# Patient Record
Sex: Male | Born: 1945
Health system: Southern US, Community
[De-identification: ages and names within clinical notes are randomized; demographics above are authoritative.]

## PROBLEM LIST (undated history)

## (undated) DIAGNOSIS — K5710 Diverticulosis of small intestine without perforation or abscess without bleeding: Secondary | ICD-10-CM

## (undated) DIAGNOSIS — C801 Malignant (primary) neoplasm, unspecified: Secondary | ICD-10-CM

## (undated) DIAGNOSIS — I714 Abdominal aortic aneurysm, without rupture, unspecified: Secondary | ICD-10-CM

## (undated) DIAGNOSIS — Z720 Tobacco use: Secondary | ICD-10-CM

## (undated) DIAGNOSIS — K2270 Barrett's esophagus without dysplasia: Secondary | ICD-10-CM

## (undated) DIAGNOSIS — E7800 Pure hypercholesterolemia, unspecified: Secondary | ICD-10-CM

## (undated) DIAGNOSIS — K219 Gastro-esophageal reflux disease without esophagitis: Secondary | ICD-10-CM

## (undated) DIAGNOSIS — Z860100 Personal history of colon polyps, unspecified: Secondary | ICD-10-CM

## (undated) DIAGNOSIS — K5730 Diverticulosis of large intestine without perforation or abscess without bleeding: Secondary | ICD-10-CM

## (undated) DIAGNOSIS — J449 Chronic obstructive pulmonary disease, unspecified: Secondary | ICD-10-CM

## (undated) DIAGNOSIS — I10 Essential (primary) hypertension: Secondary | ICD-10-CM

## (undated) DIAGNOSIS — I6522 Occlusion and stenosis of left carotid artery: Secondary | ICD-10-CM

## (undated) HISTORY — PX: PROSTATECTOMY: SHX69

## (undated) HISTORY — DX: Gastro-esophageal reflux disease without esophagitis: K21.9

## (undated) HISTORY — PX: CATARACT EXTRACTION: SUR2

## (undated) HISTORY — DX: Diverticulosis of large intestine without perforation or abscess without bleeding: K57.30

## (undated) HISTORY — DX: Diverticulosis of small intestine without perforation or abscess without bleeding: K57.10

## (undated) HISTORY — DX: Barrett's esophagus without dysplasia: K22.70

## (undated) HISTORY — DX: Tobacco use: Z72.0

## (undated) HISTORY — DX: Personal history of colonic polyps: Z86.010

## (undated) HISTORY — DX: Occlusion and stenosis of left carotid artery: I65.22

## (undated) HISTORY — PX: COLONOSCOPY: SHX174

## (undated) HISTORY — PX: ADENOIDECTOMY: SUR15

## (undated) HISTORY — DX: Personal history of colon polyps, unspecified: Z86.0100

## (undated) HISTORY — DX: Essential (primary) hypertension: I10

## (undated) HISTORY — PX: TONSILLECTOMY: SUR1361

## (undated) HISTORY — DX: Pure hypercholesterolemia, unspecified: E78.00

---

## 2002-11-19 ENCOUNTER — Ambulatory Visit (HOSPITAL_COMMUNITY): Admission: RE | Admit: 2002-11-19 | Discharge: 2002-11-19 | Payer: Self-pay | Admitting: *Deleted

## 2002-11-19 ENCOUNTER — Encounter (INDEPENDENT_AMBULATORY_CARE_PROVIDER_SITE_OTHER): Payer: Self-pay | Admitting: *Deleted

## 2004-07-07 ENCOUNTER — Encounter (INDEPENDENT_AMBULATORY_CARE_PROVIDER_SITE_OTHER): Payer: Self-pay | Admitting: Specialist

## 2004-07-07 ENCOUNTER — Ambulatory Visit (HOSPITAL_COMMUNITY): Admission: RE | Admit: 2004-07-07 | Discharge: 2004-07-07 | Payer: Self-pay | Admitting: *Deleted

## 2008-01-18 ENCOUNTER — Ambulatory Visit: Admission: RE | Admit: 2008-01-18 | Discharge: 2008-02-18 | Payer: Self-pay | Admitting: Radiation Oncology

## 2008-03-24 ENCOUNTER — Inpatient Hospital Stay (HOSPITAL_COMMUNITY): Admission: RE | Admit: 2008-03-24 | Discharge: 2008-03-25 | Payer: Self-pay | Admitting: Urology

## 2008-03-24 ENCOUNTER — Encounter: Payer: Self-pay | Admitting: Urology

## 2010-08-17 NOTE — Op Note (Signed)
NAME:  Randy Morton, Randy Morton NO.:  [id number]   MEDICAL RECORD NO.:  [id number]          PATIENT TYPE:  INP   LOCATION:  1410                         FACILITY:  [facility]   PHYSICIAN:  Excell Seltzer. Annabell Howells, M.D.    DATE OF BIRTH:  01/06/1946   DATE OF PROCEDURE:  03/24/2008  DATE OF DISCHARGE:                               OPERATIVE REPORT   PROCEDURE:  Robot-assisted laparoscopic radical prostatectomy with  bilateral pelvic lymphadenectomy.   PREOPERATIVE DIAGNOSIS:  T2a Gleason 7 adenocarcinoma of the prostate.   POSTOPERATIVE DIAGNOSIS:  T2a Gleason 7 adenocarcinoma of the prostate.   SURGEON:  Dr. Bjorn Pippin.   ASSISTANT:  Delia Chimes, nurse practitioner.   ANESTHESIA:  General.   BLOOD LOSS:  200 mL.   DRAINS:  20-French coude Foley catheter and Blake drain.   SPECIMEN:  Prostate and seminal vesicles with a right bladder neck  margin and left apical margin.  Bilateral pelvic lymph nodes were sent  as well.  There were no complications.   INDICATIONS:  Randy Morton is a 62-hour-old white male who was found to  have a prostate nodule and a rising PSA on biopsies.  He was found to  have 6 of 13 cores positive with a Gleason 7 adenocarcinoma.  After  reviewing the options, he had a 31 gram prostate.  After reviewing the  options, he has elected to pursue prostatectomy with pelvic lymph node  dissection.   FINDINGS OF PROCEDURE:  The patient was given IV Cipro, was taken  operating room where general anesthetic was induced.  He was placed in  the low-lithotomy position.  His abdomen was clipped.  A red rubber  rectal tube was placed.  His abdomen and genitalia were prepped with  Betadine solution.  He was draped in the usual sterile fashion.  A Foley  catheter was inserted following dilation of the urethra up to 24-French  with male sounds.  Once the Foley was in place, the video port was  marked to the left of the umbilicus 18 cm superior to the pubis.  A 2-cm  incision was made with a knife.  This was deepened with the Bovie.  A  hemostat was used to spread the subcutaneous fat to the fascia.  The  anterior rectus fascia was incised using the Bovie.  The muscle was  reflected laterally exposing the posterior sheath which was nicked with  a knife, and then a hemostat was used to perforate the posterior sheath  and peritoneum.  Finger was placed within the abdominal cavity ensuring  free movement and no adhesions.  A S retractor was then used to maintain  the tract while a 12-mm trocar was placed.  This was secured using a  figure-of-eight 0 Vicryl suture, and the abdomen was insufflated,  initially on low-flow and then on high flow once insufflation was noted.  The laparoscope was then placed, and the abdomen was inspected.  An  adhesion was noted in the left lower quadrant involving the sigmoid and  anterior abdominal wall.  Once this was noted, the remaining port sites  were  infiltrated with local anesthetic, and the right side ports were  placed including the 5-mm assistance port, the 8-mm robot port, and the  12-mm assistant port in the standard configuration.  Once these ports  were in place, laparoscopic scissors were used through the 5-mm port to  take down the adhesions in the left lower quadrant which were felt to  potentially impede placement of the left-sided robot ports.  Once these  adhesions had been lysed, the 2 left-side robot ports were placed in the  standard configuration.  Once all of the trocars were in place, the  robot was prepared and brought to the field and docked.  Once the robot  was docked, endoscopic shears were placed in the right robot arm.  PK  dissector and ProGrasp were placed in the second and third working robot  arm ports.   At this point, I moved to the console and began the dissection.  The  obliterated umbilical arteries were divided and the bladder which had  been filled with sterile fluid was dissected  off the anterior abdominal  wall.  Once the pubis had been identified, the bladder was drained, and  the dissection was carried down onto the anterior prostate.  The lateral  pelvic fascia was incised bilaterally, and the lateral plane was  developed between the sidewall and the prostate.  The puboprostatic  ligaments were taken down to aid exposure.  Once the dorsal vein had  been identified and sufficiently dissected free of surrounding  attachments, an Endo-GIA was used to divide the dorsal vein complex.  The prostate had been defatted anteriorly prior to this maneuver.  Once  the dorsal vein had been divided, we turned our attention to the bladder  neck.  The bladder neck was incised transversely using the Foley balloon  as a guide to location.  Once the bladder neck had been opened and the  Foley was identified, the Foley was brought out through the cystotomy  and placed under anterior traction using the fourth arm which had  previously been used to hold cephalad traction on the bladder.  The  posterior plane was then developed.  I felt I was a little bit closer to  the prostate particularly on the right side, and some additional tissue  was excised from the bladder neck and sent for a permanent margin.  The  posterior section was carried down until the structures were identified.  The patient been given indigo carmine at this point.  The ampulla vas  were identified.  The left ampule was grasped and dissected out,  divided, and then used to provide cephalad traction with the fourth arm.  The right ampule was then dissected out, divided, and added to the  fourth arm traction bundle.  The right seminal vesicle was dissected out  using cautery dissection followed by the left seminal vesicle.  Once all  the structures had then developed, the posterior plane was developed  between the prostate and Denonvilliers.  This was carried out apically  and laterally as far as possible using the  ProGrasp.  Once the posterior  plane had been developed, the anterior traction was released, and the  nerve spare was performed.  Initially, the right prostatic fascia was  incised, and the plane was developed between the prostate and the  neurovascular bundle from the base out to the apex.  This was then  repeated on the left side with a successful nerve spare.  Once the nerve  spare  had been performed, the prostatic pedicles were taken down using  clips.  Some residual apical and lateral attachments were taken down.  Then, I turned my attention to the urethra.  The residual dorsal vein  was divided using cautery dissection.  Cold scissors were used to divide  the urethra.  Once the urethra had been divided, a few residual apical  posterior attachments were taken down, and the prostate was moved out of  the field.  At the left apex, there was some tissue that was of concern.  This was dissected out and sent as a margin.  The prostate was somewhat  adherent at this point as well.  However, I felt I was able to dissect  away the abnormal tissue, and it was difficult to know if this was  prostatic extension or merely some periprostatic fat.  Once the prostate  had been removed and the residual left apical tissue dissected,  inspection revealed some bleeding approximately near the left pedicle.  This was oversewn with a 4-0 Vicryl.  Further bleeding was minimal and  appeared to be oozed from the periurethral area.  He had well-preserved  neurovascular bundles bilaterally.  At this point, the pelvic fossa was  irrigated.  Insufflation of the rectum was performed.  No evidence of  rectal injury was noted.  The irrigant was aspirated, and a sheet of  Surgicel was placed in the pelvic fossa to aid hemostasis during the  lymph node dissection.   At this point, we proceeded with the lymph node dissection.  The bladder  was dropped more posteriorly from the right pelvic sidewall.  The iliac  vein  was identified, and the plane was developed between the vein and  the lymph node package.  The obturator nerve was identified, and the  proximal portion of the lymph node packet was divided between clips.  The packet was then dissected down to the bifurcation of the iliacs and  clipped and then divided.  Great care was taken to avoid injury to the  iliac vein or obturator nerve.  The right packet was then removed.  The  left lymph node dissection was then performed.  The bladder was  dissected off of the pelvic sidewall.  The vein was identified.  The  packet was developed with care being taken to avoid the circumflex,  iliac and the obturator nerve.  He had scanty tissue on this side, but  the packet once again was controlled clips and removed for permanent  section.   Once the lymph node dissection had been completed and hemostasis was  assured, the Surgicel was removed, and a 2-0 Vicryl traction suture was  used to reapproximate the posterior urethra, Denonvilliers fascia, and  posterior bladder neck using a slip knot.  Once the posterior suture had  been then secured, a bicolor 4-0 Monocryl suture was passed, and a  running urethrovesical anastomosis was performed in the usual fashion.  Once the anastomosis was completed, a 20-French coude catheter was  inserted, and the balloon was filled with 15 mL of sterile fluid.  The  bladder was then irrigated without evidence of urethrovesical  anastomotic leakage.  The anastomotic suture was then tied, and the  bladder was irrigated once again, also with a good return.   At this point, a #10 round Blake drain was placed through the fourth arm  trocar which was then removed.  The drain was secured with a 3-0 nylon  suture.  The prostate specimen was  moved back into the midline, and the  robot was undocked.  An entrapment sac was then passed through the  camera port, and the camera was passed through the 12-mm assistance  port.  The prostate  was then manipulated into the entrapment sac which  was then engaged.  The video port trocar was then removed and then  replaced alongside this entrapment sac string.  The 12-mm assistant port  was then closed using 0 Vicryl and a suture passer.  Once that was  secured, the remaining ports were removed under visual inspection.  Finally, the video port was removed.  The video port suture was removed,  and the entrapment sac was brought up to the incision.  The fascial  incision was extended just sufficient to remove the specimen which was  handed off.  The anterior rectus fascia was then closed using a running  #1 Vicryl.  The periumbilical incision was then irrigated, and once  hemostasis was secured, all of the port sites with the exception of the  drain port were infiltrated with additional local anesthetic.  The port  sites were then closed with skin clips.  The drain tubing was cut to an  appropriate length and connected to bulb suction.  The Foley was  irrigated once again with return of indigo-carmine-stained urine.  The  catheter was then placed to straight drainage.  The drapes were removed.  Dressings were applied.  The patient was taken down from lithotomy  position.  His anesthetic was reversed.  He was moved to recovery room  in stable condition.  There were no complications.      Excell Seltzer. Annabell Howells, M.D.  Electronically Signed     JJW/MEDQ  D:  03/24/2008  T:  03/25/2008  Job:  [id number]

## 2010-08-20 NOTE — Op Note (Signed)
NAME:  Randy Morton, Randy Morton NO.:  [id number]   MEDICAL RECORD NO.:  [id number]          PATIENT TYPE:  AMB   LOCATION:  ENDO                         [facility]ACILITY[facility]   PHYSICIAN:  Georgiana Spinner, M.D.    DATE OF BIRTH:  23-Jun-1945   DATE OF PROCEDURE:  DATE OF DISCHARGE:                                 OPERATIVE REPORT   PROCEDURE:  Upper endoscopy.   INDICATIONS:  Gastroesophageal reflux disease with known Barrett's esophagus  for follow-up.   ANESTHESIA:  Demerol 50, Versed 5 mg.   PROCEDURE:  With the patient mildly sedated in the left lateral decubitus  position, the Olympus videoscopic endoscope was inserted in the mouth,  passed under direct vision through the esophagus which appeared normal until  we reached the distal esophagus and the gastroesophageal squamocolumnar  junction was photographed and biopsied. When entering the stomach, the  fundus, body appeared normal.  The antrum showed changes of erythema which  was photographed and biopsied.  The duodenal bulb, second portion of the  duodenum appeared normal.  From this point, the endoscope was slowly  withdrawn taking circumferential views of the duodenal mucosa until the  endoscope had been pulled back into the stomach, placed in retroflexion to  view the stomach from below.  The endoscope was then straightened and  withdrawn taking circumferential views of the remaining gastric and  esophageal mucosa. The patient's vital signs, pulse oximeter remained  stable. The patient tolerated the procedure well without apparent  complications.   FINDINGS:  Erythema of the antrum, biopsied.  Barrett's esophagus noted  previously, biopsied. Await biopsy report. The patient will call me for  results and follow-up with me as an outpatient.      GMO/MEDQ  D:  07/07/2004  T:  07/07/2004  Job:  [id number]

## 2010-08-20 NOTE — Op Note (Signed)
   NAME:  GLENNIE, RODDA NO.:  [id number]   MEDICAL RECORD NO.:  [id number]                   PATIENT TYPE:  AMB   LOCATION:  ENDO                                 FACILITY:  [facility]   PHYSICIAN:  Georgiana Spinner, M.D.                 DATE OF BIRTH:  1946-03-16   DATE OF PROCEDURE:  11/19/2002  DATE OF DISCHARGE:                                 OPERATIVE REPORT   PROCEDURE:  Upper endoscopy.   INDICATIONS:  Hemoccult positivity.   ANESTHESIA:  Demerol 60, Versed 6 mg.   DESCRIPTION OF PROCEDURE:  With the patient mildly sedated in the left  lateral decubitus position the Olympus video endoscope was inserted in the  mouth and passed under direct visualization through the esophagus which  appeared normal. We photographed the distal esophagus and took biopsies  around the perimeter of the GE junction to rule out possibility of Barrett's  esophagus.   We entered into the stomach. The fundus body, antrum, duodenal bulb and 2nd  portion of the duodenum were all viewed and appeared normal.   From this point the endoscope was slowly withdrawn, taking circumferential  views of the entire duodenal mucosa until the endoscope was then pulled back  into the stomach and placed in retroflexion to view the stomach from below.  The endoscope was then  straightened  and  withdrawn, taking circumferential  views of the remaining gastric and esophageal mucosa.   The patient's vital signs and pulse oximetry remained stable. The patient  tolerated the procedure well without apparent complications.   FINDINGS:  Unremarkable examination  with biopsies taken to rule out  possible short-segment Barrett's esophagus distally.   PLAN:  Await biopsy report. The patient will call me for results and follow  up with me as an outpatient. Proceed to colonoscopy as planned.                                               Georgiana Spinner, M.D.    GMO/MEDQ  D:  11/19/2002  T:   11/19/2002  Job:  [id number]

## 2010-08-20 NOTE — Op Note (Signed)
   NAME:  Randy Morton, Randy Morton NO.:  [id number]   MEDICAL RECORD NO.:  [id number]                   PATIENT TYPE:  AMB   LOCATION:  ENDO                                 FACILITY:  [facility]   PHYSICIAN:  Georgiana Spinner, M.D.                 DATE OF BIRTH:  03-05-1946   DATE OF PROCEDURE:  11/19/2002  DATE OF DISCHARGE:                                 OPERATIVE REPORT   PROCEDURE PERFORMED:  Colonoscopy.   ENDOSCOPIST:  Georgiana Spinner, M.D.   INDICATIONS FOR PROCEDURE:  Colon polyps.  Hemoccult positivity.   ANESTHESIA:  Additional Demerol 10 mg, Versed 2 mg.   DESCRIPTION OF PROCEDURE:  With the patient mildly sedated in the left  lateral decubitus position, the Olympus video colonoscope was inserted in  the rectum and passed under direct vision to the cecum, identified by the  ileocecal valve and appendiceal orifice, the former was photographed.  Adjacent to the appendiceal orifice was a flat polyp.  It was approximately  8 mm to 1 cm in size, apparently, and it was photographed and it was removed  using hot biopsy forceps technique setting of 20/150 blended current.  From  this point the colonoscope was slowly withdrawn taking circumferential views  of the entire colonic mucosa as we pulled all the way back to the rectum  stopping only at 60 cm from the anal verge at which point another polyp was  seen and photographed and removed using hot biopsy forceps technique, once  again with the same setting.  The endoscope was then withdrawn all the way  to the rectum which appeared normal on direct and showed hemorrhoids on  retroflex view.  The endoscope was straightened and withdrawn.  The  patient's vital signs and pulse oximeter remained stable.  The patient  tolerated the procedure well without apparent complications.   FINDINGS:  Internal hemorrhoids.  Polyps as described above at 60 cm from  the anal verge and in the cecum.   PLAN:  Await biopsy report.   Patient will call me for results and follow-up  with me as an outpatient.                                                 Georgiana Spinner, M.D.    GMO/MEDQ  D:  11/19/2002  T:  11/19/2002  Job:  [id number]

## 2011-01-07 LAB — HEMOGLOBIN AND HEMATOCRIT, BLOOD
HCT: 36.6 % — ABNORMAL LOW (ref 39.0–52.0)
HCT: 39.7 % (ref 39.0–52.0)
Hemoglobin: 14.7 g/dL (ref 13.0–17.0)

## 2011-01-07 LAB — TYPE AND SCREEN
ABO/RH(D): A POS
Antibody Screen: NEGATIVE

## 2011-01-07 LAB — BASIC METABOLIC PANEL
Calcium: 9.4 mg/dL (ref 8.4–10.5)
GFR calc non Af Amer: >60 mL/min (ref >60)
Potassium: 4.1 mEq/L (ref 3.5–5.1)
Sodium: 138 mEq/L (ref 135–145)

## 2011-04-27 ENCOUNTER — Other Ambulatory Visit: Payer: Self-pay | Admitting: Dermatology

## 2011-05-17 ENCOUNTER — Other Ambulatory Visit: Payer: Self-pay | Admitting: Dermatology

## 2012-10-16 ENCOUNTER — Other Ambulatory Visit: Payer: Self-pay | Admitting: Dermatology

## 2013-04-23 ENCOUNTER — Other Ambulatory Visit: Payer: Self-pay | Admitting: Dermatology

## 2014-07-09 ENCOUNTER — Other Ambulatory Visit: Payer: Self-pay | Admitting: Dermatology

## 2015-07-08 DIAGNOSIS — Z85828 Personal history of other malignant neoplasm of skin: Secondary | ICD-10-CM | POA: Diagnosis not present

## 2015-07-08 DIAGNOSIS — D485 Neoplasm of uncertain behavior of skin: Secondary | ICD-10-CM | POA: Diagnosis not present

## 2015-07-08 DIAGNOSIS — Z85820 Personal history of malignant melanoma of skin: Secondary | ICD-10-CM | POA: Diagnosis not present

## 2015-07-08 DIAGNOSIS — D692 Other nonthrombocytopenic purpura: Secondary | ICD-10-CM | POA: Diagnosis not present

## 2015-07-08 DIAGNOSIS — D2361 Other benign neoplasm of skin of right upper limb, including shoulder: Secondary | ICD-10-CM | POA: Diagnosis not present

## 2015-07-08 DIAGNOSIS — D235 Other benign neoplasm of skin of trunk: Secondary | ICD-10-CM | POA: Diagnosis not present

## 2015-07-08 DIAGNOSIS — C44319 Basal cell carcinoma of skin of other parts of face: Secondary | ICD-10-CM | POA: Diagnosis not present

## 2015-07-08 DIAGNOSIS — L821 Other seborrheic keratosis: Secondary | ICD-10-CM | POA: Diagnosis not present

## 2015-07-08 DIAGNOSIS — L603 Nail dystrophy: Secondary | ICD-10-CM | POA: Diagnosis not present

## 2015-09-14 DIAGNOSIS — E7800 Pure hypercholesterolemia, unspecified: Secondary | ICD-10-CM | POA: Diagnosis not present

## 2015-09-14 DIAGNOSIS — Z125 Encounter for screening for malignant neoplasm of prostate: Secondary | ICD-10-CM | POA: Diagnosis not present

## 2015-09-21 DIAGNOSIS — E8809 Other disorders of plasma-protein metabolism, not elsewhere classified: Secondary | ICD-10-CM | POA: Diagnosis not present

## 2015-09-22 ENCOUNTER — Other Ambulatory Visit: Payer: Self-pay | Admitting: Internal Medicine

## 2015-09-22 DIAGNOSIS — R221 Localized swelling, mass and lump, neck: Secondary | ICD-10-CM

## 2015-10-08 ENCOUNTER — Ambulatory Visit
Admission: RE | Admit: 2015-10-08 | Discharge: 2015-10-08 | Disposition: A | Discharge status: Home / Self Care | Payer: Medicare Other | Source: Ambulatory Visit | Attending: Internal Medicine | Admitting: Internal Medicine

## 2015-10-08 DIAGNOSIS — R221 Localized swelling, mass and lump, neck: Secondary | ICD-10-CM

## 2015-10-08 DIAGNOSIS — E042 Nontoxic multinodular goiter: Secondary | ICD-10-CM | POA: Diagnosis not present

## 2015-10-14 DIAGNOSIS — E7800 Pure hypercholesterolemia, unspecified: Secondary | ICD-10-CM | POA: Diagnosis not present

## 2015-10-14 DIAGNOSIS — Z8719 Personal history of other diseases of the digestive system: Secondary | ICD-10-CM | POA: Diagnosis not present

## 2015-10-14 DIAGNOSIS — K219 Gastro-esophageal reflux disease without esophagitis: Secondary | ICD-10-CM | POA: Diagnosis not present

## 2015-10-14 DIAGNOSIS — Z0000 Encounter for general adult medical examination without abnormal findings: Secondary | ICD-10-CM | POA: Diagnosis not present

## 2015-10-20 DIAGNOSIS — D170 Benign lipomatous neoplasm of skin and subcutaneous tissue of head, face and neck: Secondary | ICD-10-CM | POA: Diagnosis not present

## 2015-11-26 ENCOUNTER — Telehealth: Payer: Self-pay | Admitting: Acute Care

## 2015-11-26 NOTE — Telephone Encounter (Signed)
Referral was canceled on 11/18/15 for the lung cancer screening program due to several unsuccessful attempts to contact pt. I sent notification to Dr. Pennie Banter office via fax on 11/18/15. Nothing further needed.

## 2016-05-26 DIAGNOSIS — H524 Presbyopia: Secondary | ICD-10-CM | POA: Diagnosis not present

## 2017-02-27 DIAGNOSIS — Z85828 Personal history of other malignant neoplasm of skin: Secondary | ICD-10-CM | POA: Diagnosis not present

## 2017-02-27 DIAGNOSIS — Z85820 Personal history of malignant melanoma of skin: Secondary | ICD-10-CM | POA: Diagnosis not present

## 2017-02-27 DIAGNOSIS — D235 Other benign neoplasm of skin of trunk: Secondary | ICD-10-CM | POA: Diagnosis not present

## 2017-02-27 DIAGNOSIS — C44319 Basal cell carcinoma of skin of other parts of face: Secondary | ICD-10-CM | POA: Diagnosis not present

## 2017-02-27 DIAGNOSIS — L821 Other seborrheic keratosis: Secondary | ICD-10-CM | POA: Diagnosis not present

## 2017-04-12 DIAGNOSIS — A09 Infectious gastroenteritis and colitis, unspecified: Secondary | ICD-10-CM | POA: Diagnosis not present

## 2017-04-12 DIAGNOSIS — I10 Essential (primary) hypertension: Secondary | ICD-10-CM | POA: Diagnosis not present

## 2017-04-26 DIAGNOSIS — Z125 Encounter for screening for malignant neoplasm of prostate: Secondary | ICD-10-CM | POA: Diagnosis not present

## 2017-04-26 DIAGNOSIS — Z7289 Other problems related to lifestyle: Secondary | ICD-10-CM | POA: Diagnosis not present

## 2017-04-26 DIAGNOSIS — E7800 Pure hypercholesterolemia, unspecified: Secondary | ICD-10-CM | POA: Diagnosis not present

## 2017-04-26 DIAGNOSIS — Z0000 Encounter for general adult medical examination without abnormal findings: Secondary | ICD-10-CM | POA: Diagnosis not present

## 2017-04-26 DIAGNOSIS — I10 Essential (primary) hypertension: Secondary | ICD-10-CM | POA: Diagnosis not present

## 2017-05-03 DIAGNOSIS — K219 Gastro-esophageal reflux disease without esophagitis: Secondary | ICD-10-CM | POA: Diagnosis not present

## 2017-05-03 DIAGNOSIS — Z0001 Encounter for general adult medical examination with abnormal findings: Secondary | ICD-10-CM | POA: Diagnosis not present

## 2017-05-03 DIAGNOSIS — N529 Male erectile dysfunction, unspecified: Secondary | ICD-10-CM | POA: Diagnosis not present

## 2017-05-03 DIAGNOSIS — I10 Essential (primary) hypertension: Secondary | ICD-10-CM | POA: Diagnosis not present

## 2017-05-09 ENCOUNTER — Other Ambulatory Visit: Payer: Self-pay | Admitting: Acute Care

## 2017-05-09 DIAGNOSIS — Z122 Encounter for screening for malignant neoplasm of respiratory organs: Secondary | ICD-10-CM

## 2017-05-09 DIAGNOSIS — F17210 Nicotine dependence, cigarettes, uncomplicated: Secondary | ICD-10-CM

## 2017-05-20 ENCOUNTER — Emergency Department (HOSPITAL_COMMUNITY)
Admission: EM | Admit: 2017-05-20 | Discharge: 2017-05-20 | Disposition: A | Discharge status: Home / Self Care | Payer: Medicare Other | Attending: Emergency Medicine | Admitting: Emergency Medicine

## 2017-05-20 ENCOUNTER — Other Ambulatory Visit: Payer: Self-pay

## 2017-05-20 ENCOUNTER — Emergency Department (HOSPITAL_COMMUNITY): Payer: Medicare Other

## 2017-05-20 ENCOUNTER — Encounter (HOSPITAL_COMMUNITY): Payer: Self-pay | Admitting: *Deleted

## 2017-05-20 DIAGNOSIS — R05 Cough: Secondary | ICD-10-CM | POA: Diagnosis not present

## 2017-05-20 DIAGNOSIS — J101 Influenza due to other identified influenza virus with other respiratory manifestations: Secondary | ICD-10-CM | POA: Diagnosis not present

## 2017-05-20 DIAGNOSIS — Z859 Personal history of malignant neoplasm, unspecified: Secondary | ICD-10-CM | POA: Insufficient documentation

## 2017-05-20 DIAGNOSIS — R6889 Other general symptoms and signs: Secondary | ICD-10-CM | POA: Diagnosis present

## 2017-05-20 DIAGNOSIS — F17200 Nicotine dependence, unspecified, uncomplicated: Secondary | ICD-10-CM | POA: Insufficient documentation

## 2017-05-20 HISTORY — DX: Malignant (primary) neoplasm, unspecified: C80.1

## 2017-05-20 LAB — CBC WITH DIFFERENTIAL/PLATELET
BASOS ABS: 0 10*3/uL (ref 0.0–0.1)
Basophils Relative: 0 %
EOS ABS: 0 10*3/uL (ref 0.0–0.7)
Eosinophils Relative: 0 %
HCT: 43.5 % (ref 39.0–52.0)
HEMOGLOBIN: 15.3 g/dL (ref 13.0–17.0)
LYMPHS PCT: 16 %
Lymphs Abs: 0.9 10*3/uL (ref 0.7–4.0)
MCH: 32.3 pg (ref 26.0–34.0)
MCHC: 35.2 g/dL (ref 30.0–36.0)
MCV: 91.8 fL (ref 78.0–100.0)
Monocytes Absolute: 0.5 10*3/uL (ref 0.1–1.0)
Monocytes Relative: 9 %
NEUTROS ABS: 4.3 10*3/uL (ref 1.7–7.7)
Neutrophils Relative %: 75 %
Platelets: 105 10*3/uL — ABNORMAL LOW (ref 150–400)
RBC: 4.74 MIL/uL (ref 4.22–5.81)
RDW: 13.3 % (ref 11.5–15.5)
WBC: 5.7 10*3/uL (ref 4.0–10.5)

## 2017-05-20 LAB — BASIC METABOLIC PANEL
Anion gap: 12 (ref 5–15)
BUN: 19 mg/dL (ref 6–20)
CALCIUM: 9 mg/dL (ref 8.9–10.3)
CO2: 23 mmol/L (ref 22–32)
CREATININE: 1.33 mg/dL — AB (ref 0.61–1.24)
Chloride: 99 mmol/L — ABNORMAL LOW (ref 101–111)
GFR calc non Af Amer: 51 mL/min — ABNORMAL LOW (ref >60)
GFR, EST AFRICAN AMERICAN: 60 mL/min — AB (ref >60)
Glucose, Bld: 92 mg/dL (ref 65–99)
Potassium: 4 mmol/L (ref 3.5–5.1)
SODIUM: 134 mmol/L — AB (ref 135–145)

## 2017-05-20 LAB — INFLUENZA PANEL BY PCR (TYPE A & B)
INFLAPCR: POSITIVE — AB
INFLBPCR: NEGATIVE

## 2017-05-20 LAB — I-STAT CG4 LACTIC ACID, ED: LACTIC ACID, VENOUS: 1.35 mmol/L (ref 0.5–1.9)

## 2017-05-20 MED ORDER — SODIUM CHLORIDE 0.9 % IV BOLUS (SEPSIS)
1000.0000 mL | Freq: Once | INTRAVENOUS | Status: AC
  Administered 2017-05-20: 1000 mL via INTRAVENOUS

## 2017-05-20 MED ORDER — ETOMIDATE 2 MG/ML IV SOLN: 0.3000 mg/kg | Freq: Once | INTRAVENOUS | Status: DC

## 2017-05-20 MED ORDER — OSELTAMIVIR PHOSPHATE 75 MG PO CAPS
75.0000 mg | ORAL_CAPSULE | Freq: Once | ORAL | Status: AC
Start: 2017-05-20 — End: 2017-05-20
  Administered 2017-05-20: 75 mg via ORAL
  Filled 2017-05-20: qty 1

## 2017-05-20 MED ORDER — ACETAMINOPHEN 325 MG PO TABS
650.0000 mg | ORAL_TABLET | Freq: Once | ORAL | Status: AC
  Administered 2017-05-20: 650 mg via ORAL
  Filled 2017-05-20: qty 2

## 2017-05-20 MED ORDER — OSELTAMIVIR PHOSPHATE 75 MG PO CAPS: 75.0000 mg | ORAL_CAPSULE | Freq: Two times a day (BID) | ORAL | 0 refills | Status: DC

## 2017-05-20 MED ORDER — BENZONATATE 100 MG PO CAPS: 100.0000 mg | ORAL_CAPSULE | Freq: Three times a day (TID) | ORAL | 0 refills | Status: DC

## 2017-05-20 MED ORDER — SUCCINYLCHOLINE CHLORIDE 20 MG/ML IJ SOLN: 100.0000 mg | Freq: Once | INTRAMUSCULAR | Status: DC

## 2017-05-20 NOTE — ED Triage Notes (Signed)
Has had a cold for about 3 weeks, yesterday developed body aches, today dizziness, feels bad all over, poor appetite.

## 2017-05-20 NOTE — ED Provider Notes (Signed)
[facility] DEPT Provider Note   CSN: [id number] Arrival date & time: 05/20/17  1039     History   Chief Complaint Chief Complaint  Patient presents with  . Influenza    HPI Randy Morton is a 72 y.o. male.  HPI Randy Morton is a 72 y.o. male presents to ED with complaint of cough, body aches, chills, subjective fever. States has had sinus congestion over the last 3 weeks. Worsening symptoms began 2-3 days ago. Denies nausea, vomiting, diarrhea. No chest pain or abdominal pain. Reports mild headache. No neck pain or stiffness.  Reports decreased appetite.  Is has been taking Tylenol with no relief of his symptoms.  States nothing is making his symptoms better or worse.  Past Medical History:  Diagnosis Date  . Cancer ([place])     There are no active problems to display for this patient.   Past Surgical History:  Procedure Laterality Date  . PROSTATECTOMY    . TONSILLECTOMY         Home Medications    Prior to Admission medications   Not on File    Family History No family history on file.  Social History Social History   Tobacco Use  . Smoking status: Current Every Day Smoker  . Smokeless tobacco: Never Used  Substance Use Topics  . Alcohol use: Yes  . Drug use: No     Allergies   Patient has no known allergies.   Review of Systems Review of Systems  Constitutional: Positive for appetite change, chills, fatigue and fever.  HENT: Positive for congestion and sore throat.   Respiratory: Positive for cough. Negative for chest tightness and shortness of breath.   Cardiovascular: Negative for chest pain, palpitations and leg swelling.  Gastrointestinal: Negative for abdominal distention, abdominal pain, diarrhea, nausea and vomiting.  Genitourinary: Negative for dysuria, frequency, hematuria and urgency.  Musculoskeletal: Positive for myalgias. Negative for neck pain and neck stiffness.  Skin: Negative for rash.    Allergic/Immunologic: Negative for immunocompromised state.  Neurological: Positive for tremors and headaches. Negative for dizziness, weakness, light-headedness and numbness.  All other systems reviewed and are negative.    Physical Exam Updated Vital Signs BP 109/72 (BP Location: Left Arm)   Pulse (!) 103   Temp 99.8 F (37.7 C) (Oral)   Resp 18   Ht 5\' 10"  (1.778 m)   Wt 95.7 kg (211 lb)   SpO2 96%   BMI 30.28 kg/m   Physical Exam  Constitutional: He appears well-developed and well-nourished.  rigorous  HENT:  Head: Normocephalic and atraumatic.  Oropharynx is erythematous, uvula midline, no exudate.  Nasal congestion  Eyes: Conjunctivae are normal.  Neck: Neck supple.  No meningismus  Cardiovascular: Regular rhythm and normal heart sounds.  Tachycardic  Pulmonary/Chest: Effort normal. No respiratory distress. He has no wheezes. He has no rales.  Abdominal: Soft. Bowel sounds are normal. He exhibits no distension. There is no tenderness. There is no rebound.  Musculoskeletal: He exhibits no edema.  Neurological: He is alert.  Skin: Skin is warm and dry.  Nursing note and vitals reviewed.    ED Treatments / Results  Labs (all labs ordered are listed, but only abnormal results are displayed) Labs Reviewed  INFLUENZA PANEL BY PCR (TYPE A & B) - Abnormal; Notable for the following components:      Result Value   Influenza A By PCR POSITIVE (*)    All other components within normal limits  CBC WITH DIFFERENTIAL/PLATELET - Abnormal; Notable for the following components:   Platelets 105 (*)    All other components within normal limits  BASIC METABOLIC PANEL - Abnormal; Notable for the following components:   Sodium 134 (*)    Chloride 99 (*)    Creatinine, Ser 1.33 (*)    GFR calc non Af Amer 51 (*)    GFR calc Af Amer 60 (*)    All other components within normal limits  URINALYSIS, ROUTINE W REFLEX MICROSCOPIC  I-STAT CG4 LACTIC ACID, ED  I-STAT CG4 LACTIC  ACID, ED    EKG  EKG Interpretation None       Radiology Dg Chest 2 View  Result Date: 05/20/2017 CLINICAL DATA:  Cough and congestion. EXAM: CHEST  2 VIEW COMPARISON:  Chest x-ray dated March 20, 2008. FINDINGS: The heart size and mediastinal contours are within normal limits. Both lungs are clear. The visualized skeletal structures are unremarkable. IMPRESSION: No active cardiopulmonary disease. Electronically Signed   By: Titus Dubin M.D.   On: 05/20/2017 16:13    Procedures Procedures (including critical care time)  Medications Ordered in ED Medications - No data to display   Initial Impression / Assessment and Plan / ED Course  I have reviewed the triage vital signs and the nursing notes.  Pertinent labs & imaging results that were available during my care of the patient were reviewed by me and considered in my medical decision making (see chart for details).     Patient with temperature 101.9, measured by myself after patient found to be having rigors on exam.  He is having upper respiratory symptoms, nasal congestion, cough, body aches, generalized weakness, decreased appetite.  Symptoms most concerning for influenza.  I will check basic labs, urinalysis, chest x-ray, flu panel.  Will give IV fluids and Tylenol.  Will reassess after  5:58 PM X-rays negative.  Labs overall unremarkable, creatinine slightly elevated at 1.3, do not know patient's baseline.  He received 1 L of IV fluids.  His lactic acid and white blood cell count are normal.  Chest x-ray is normal.  He is positive for influenza A.  He states he feels much better after Tylenol and fluids.  He would like to go home.  At time of reassessment and discharge, vital signs are normal.  Will discharge home with Tamiflu, first dose given in emergency department.  Will give Tessalon for cough, continue Tylenol or Motrin for fever, rest, drink plenty of fluids, follow-up with family doctor.  Return precautions  discussed.  Patient is comfortable going home, understands discharge instructions and return precautions  Vitals:   05/20/17 1529 05/20/17 1740  BP: 109/72 (!) 152/83  Pulse: (!) 103 89  Resp: 18 18  Temp: 99.8 F (37.7 C)   SpO2: 96% 96%     Final Clinical Impressions(s) / ED Diagnoses   Final diagnoses:  Influenza A    ED Discharge Orders        Ordered    oseltamivir (TAMIFLU) 75 MG capsule  Every 12 hours     05/20/17 1801    benzonatate (TESSALON) 100 MG capsule  Every 8 hours     05/20/17 1801       Jeannett Senior, PA-C 05/20/17 1802    Carmin Muskrat, MD 05/20/17 2333

## 2017-05-20 NOTE — ED Notes (Signed)
Bed: RA15 Expected date:  Expected time:  Means of arrival:  Comments: No bed

## 2017-05-20 NOTE — ED Notes (Signed)
Pt aware urine sample is needed, states that he cannot provide one at this time.

## 2017-05-20 NOTE — Discharge Instructions (Signed)
Rest.  Drink plenty of fluids.  Take Tylenol or Motrin for fever on a regular basis.  Take Tamiflu as prescribed to help with your symptoms.  If worsening, return to emergency department.  Otherwise follow-up with your family doctor for recheck in 3 days.

## 2017-05-23 DIAGNOSIS — E7800 Pure hypercholesterolemia, unspecified: Secondary | ICD-10-CM | POA: Diagnosis not present

## 2017-05-23 DIAGNOSIS — F17200 Nicotine dependence, unspecified, uncomplicated: Secondary | ICD-10-CM | POA: Diagnosis not present

## 2017-05-23 DIAGNOSIS — I10 Essential (primary) hypertension: Secondary | ICD-10-CM | POA: Diagnosis not present

## 2017-05-23 DIAGNOSIS — J111 Influenza due to unidentified influenza virus with other respiratory manifestations: Secondary | ICD-10-CM | POA: Diagnosis not present

## 2017-05-30 ENCOUNTER — Telehealth: Payer: Self-pay | Admitting: Acute Care

## 2017-05-31 NOTE — Telephone Encounter (Signed)
Noted. Randy Morton, just reschedule as you are able. Thanks so much!!

## 2017-05-31 NOTE — Telephone Encounter (Signed)
Spoke with pt and rescheduled [facility] 06/16/17 3:30 CT will be rescheduled Nothing further needed

## 2017-05-31 NOTE — Telephone Encounter (Signed)
Will route this to [place] for an [place] and for them to try to reschedule visit for pt once he is well.   Pt was supposed to come yesterday, 05/30/17 for the visit but it was cancelled.

## 2017-06-02 ENCOUNTER — Encounter: Payer: Medicare Other | Admitting: Acute Care

## 2017-06-02 ENCOUNTER — Inpatient Hospital Stay: Admission: RE | Admit: 2017-06-02 | Payer: Medicare Other | Source: Ambulatory Visit

## 2017-06-12 ENCOUNTER — Telehealth: Payer: Self-pay | Admitting: Acute Care

## 2017-06-16 ENCOUNTER — Encounter: Payer: Medicare Other | Admitting: Acute Care

## 2017-06-16 ENCOUNTER — Inpatient Hospital Stay: Admission: RE | Admit: 2017-06-16 | Payer: Medicare Other | Source: Ambulatory Visit

## 2017-06-16 NOTE — Telephone Encounter (Signed)
Pt's wife returning call. Cb is [phone].

## 2017-06-16 NOTE — Telephone Encounter (Signed)
LMTC x 1  

## 2017-06-16 NOTE — Telephone Encounter (Signed)
Spoke with pt's wife and rescheduled [facility] to 06/23/17 4:00 CT will be rescheduled Nothing further needed

## 2017-06-23 ENCOUNTER — Ambulatory Visit (INDEPENDENT_AMBULATORY_CARE_PROVIDER_SITE_OTHER)
Admission: RE | Admit: 2017-06-23 | Discharge: 2017-06-23 | Disposition: A | Discharge status: Home / Self Care | Payer: Medicare Other | Source: Ambulatory Visit | Attending: Acute Care | Admitting: Acute Care

## 2017-06-23 ENCOUNTER — Ambulatory Visit (INDEPENDENT_AMBULATORY_CARE_PROVIDER_SITE_OTHER): Payer: Medicare Other | Admitting: Acute Care

## 2017-06-23 ENCOUNTER — Encounter: Payer: Self-pay | Admitting: Acute Care

## 2017-06-23 DIAGNOSIS — Z122 Encounter for screening for malignant neoplasm of respiratory organs: Secondary | ICD-10-CM

## 2017-06-23 DIAGNOSIS — F17210 Nicotine dependence, cigarettes, uncomplicated: Secondary | ICD-10-CM

## 2017-06-23 NOTE — Progress Notes (Signed)
Shared Decision Making Visit Lung Cancer Screening Program [phone])   Eligibility:  Age 72 y.o.  Pack Years Smoking History Calculation 41 pack year smoking history (# packs/per year x # years smoked)  Recent History of coughing up blood  no  Unexplained weight loss? no ( >Than 15 pounds within the last 6 months )  Prior History Lung / other cancer no (Diagnosis within the last 5 years already requiring surveillance chest CT Scans).  Smoking Status Current Smoker  Former Smokers: Years since quit:   Quit Date: NA  Visit Components:  Discussion included one or more decision making aids. yes  Discussion included risk/benefits of screening. yes  Discussion included potential follow up diagnostic testing for abnormal scans. yes  Discussion included meaning and risk of over diagnosis. yes  Discussion included meaning and risk of False Positives. yes  Discussion included meaning of total radiation exposure. yes  Counseling Included:  Importance of adherence to annual lung cancer LDCT screening. yes  Impact of comorbidities on ability to participate in the program. yes  Ability and willingness to under diagnostic treatment. yes  Smoking Cessation Counseling:  Current Smokers:   Discussed importance of smoking cessation. yes  Information about tobacco cessation classes and interventions provided to patient. yes  Patient provided with "ticket" for LDCT Scan. yes  Symptomatic Patient. no  Counseling  Diagnosis Code: Tobacco Use Z72.0  Asymptomatic Patient yes  Counseling (Intermediate counseling: > three minutes counseling) M0102  Former Smokers:   Discussed the importance of maintaining cigarette abstinence. yes  Diagnosis Code: Personal History of Nicotine Dependence. V25.366  Information about tobacco cessation classes and interventions provided to patient. Yes  Patient provided with "ticket" for LDCT Scan. yes  Written Order for Lung Cancer Screening  with LDCT placed in Epic. Yes (CT Chest Lung Cancer Screening Low Dose W/O CM) [id number] Z12.2-Screening of respiratory organs Z87.891-Personal history of nicotine dependence  I have spent 25 minutes of face to face time with Mr. And Mrs. Bouwman  discussing the risks and benefits of lung cancer screening. We viewed a power point together that explained in detail the above noted topics. We paused at intervals to allow for questions to be asked and answered to ensure understanding.We discussed that the single most powerful action that he can take to decrease his risk of developing lung cancer is to quit smoking. We discussed whether or not he is ready to commit to setting a quit date. He is not ready to set a quit date. We discussed options for tools to aid in quitting smoking including nicotine replacement therapy, non-nicotine medications, support groups, Quit Smart classes, and behavior modification. We discussed that often times setting smaller, more achievable goals, such as eliminating 1 cigarette a day for a week and then 2 cigarettes a day for a week can be helpful in slowly decreasing the number of cigarettes smoked. This allows for a sense of accomplishment as well as providing a clinical benefit. I gave him the " Be Stronger Than Your Excuses" card with contact information for community resources, classes, free nicotine replacement therapy, and access to mobile apps, text messaging, and on-line smoking cessation help. I have also given him my card and contact information in the event he needs to contact me. We discussed the time and location of the scan, and that either Doroteo Glassman RN or I will call with the results within 24-48 hours of receiving them. I have offered him  a copy of the power point we  viewed  as a resource in the event they need reinforcement of the concepts we discussed today in the office. The patient verbalized understanding of all of  the above and had no further questions upon  leaving the office. They have my contact information in the event they have any further questions.  I spent 4  minutes counseling on smoking cessation and the health risks of continued tobacco abuse.  I explained to the patient that there has been a high incidence of coronary artery disease noted on these exams. I explained that this is a non-gated exam therefore degree or severity cannot be determined. This patient is  Statin currently on statin  therapy. I have asked the patient to follow-up with their PCP regarding any incidental finding of coronary artery disease and management with diet or medication as their PCP  feels is clinically indicated. The patient verbalized understanding of the above and had no further questions upon completion of the visit.      Magdalen Spatz, NP 06/23/2017 4:44 PM

## 2017-06-28 ENCOUNTER — Other Ambulatory Visit: Payer: Self-pay | Admitting: Acute Care

## 2017-06-28 DIAGNOSIS — F17210 Nicotine dependence, cigarettes, uncomplicated: Secondary | ICD-10-CM

## 2017-06-28 DIAGNOSIS — Z122 Encounter for screening for malignant neoplasm of respiratory organs: Secondary | ICD-10-CM

## 2017-08-09 DIAGNOSIS — I2510 Atherosclerotic heart disease of native coronary artery without angina pectoris: Secondary | ICD-10-CM | POA: Diagnosis not present

## 2017-08-09 DIAGNOSIS — Z720 Tobacco use: Secondary | ICD-10-CM | POA: Diagnosis not present

## 2017-08-09 DIAGNOSIS — E7800 Pure hypercholesterolemia, unspecified: Secondary | ICD-10-CM | POA: Diagnosis not present

## 2017-08-09 DIAGNOSIS — I10 Essential (primary) hypertension: Secondary | ICD-10-CM | POA: Diagnosis not present

## 2017-09-20 DIAGNOSIS — Z136 Encounter for screening for cardiovascular disorders: Secondary | ICD-10-CM | POA: Diagnosis not present

## 2017-09-20 DIAGNOSIS — I10 Essential (primary) hypertension: Secondary | ICD-10-CM | POA: Diagnosis not present

## 2017-09-20 DIAGNOSIS — Z720 Tobacco use: Secondary | ICD-10-CM | POA: Diagnosis not present

## 2017-09-22 DIAGNOSIS — R062 Wheezing: Secondary | ICD-10-CM | POA: Diagnosis not present

## 2017-09-22 DIAGNOSIS — J22 Unspecified acute lower respiratory infection: Secondary | ICD-10-CM | POA: Diagnosis not present

## 2017-10-02 DIAGNOSIS — I10 Essential (primary) hypertension: Secondary | ICD-10-CM | POA: Diagnosis not present

## 2017-10-02 DIAGNOSIS — I4892 Unspecified atrial flutter: Secondary | ICD-10-CM | POA: Diagnosis not present

## 2017-10-13 DIAGNOSIS — I10 Essential (primary) hypertension: Secondary | ICD-10-CM | POA: Diagnosis not present

## 2017-10-13 DIAGNOSIS — I2510 Atherosclerotic heart disease of native coronary artery without angina pectoris: Secondary | ICD-10-CM | POA: Diagnosis not present

## 2017-10-13 DIAGNOSIS — Z720 Tobacco use: Secondary | ICD-10-CM | POA: Diagnosis not present

## 2017-10-13 DIAGNOSIS — E7800 Pure hypercholesterolemia, unspecified: Secondary | ICD-10-CM | POA: Diagnosis not present

## 2017-11-21 DIAGNOSIS — I10 Essential (primary) hypertension: Secondary | ICD-10-CM | POA: Diagnosis not present

## 2017-11-21 DIAGNOSIS — Z720 Tobacco use: Secondary | ICD-10-CM | POA: Diagnosis not present

## 2017-11-21 DIAGNOSIS — I714 Abdominal aortic aneurysm, without rupture: Secondary | ICD-10-CM | POA: Diagnosis not present

## 2017-11-21 DIAGNOSIS — I2510 Atherosclerotic heart disease of native coronary artery without angina pectoris: Secondary | ICD-10-CM | POA: Diagnosis not present

## 2017-12-14 ENCOUNTER — Institutional Professional Consult (permissible substitution): Payer: Medicare Other | Admitting: Internal Medicine

## 2017-12-22 ENCOUNTER — Institutional Professional Consult (permissible substitution): Payer: Medicare Other | Admitting: Emergency Medicine

## 2018-01-05 ENCOUNTER — Institutional Professional Consult (permissible substitution): Payer: Medicare Other | Admitting: Emergency Medicine

## 2018-01-29 ENCOUNTER — Ambulatory Visit: Payer: Medicare Other | Admitting: Emergency Medicine

## 2018-01-29 ENCOUNTER — Encounter: Payer: Self-pay | Admitting: *Deleted

## 2018-01-29 VITALS — BP 122/86 | HR 63 | Ht 69.5 in | Wt 215.0 lb

## 2018-01-29 DIAGNOSIS — Z720 Tobacco use: Secondary | ICD-10-CM

## 2018-01-29 DIAGNOSIS — J449 Chronic obstructive pulmonary disease, unspecified: Secondary | ICD-10-CM

## 2018-01-29 DIAGNOSIS — F17210 Nicotine dependence, cigarettes, uncomplicated: Secondary | ICD-10-CM

## 2018-01-29 HISTORY — DX: Tobacco use: Z72.0

## 2018-01-29 NOTE — Progress Notes (Signed)
Subjective:    Patient ID: Randy Morton, male    DOB: 05/14/1945, 72 y.o.   MRN: [id number]  HPI 72 year old active smoker (40+ pack years, currently 0.5pk/day) with a history of hypertension, GERD with Barrett's esophagus, Prostate CA and surgery, hypercholesterolemia. He is participating in the LDCT screening program, had a CT chest 06/2017 that was RADS 1, did show some subtle evidence for centrilobular emphysema.   He is very active, denies any dyspnea. He has cough every morning with clear mucous.  He is here today to discuss the abnormal CT, the possibility that he has COPD.    Review of Systems  Constitutional: Negative for fever and unexpected weight change.  HENT: Negative for congestion, dental problem, ear pain, nosebleeds, postnasal drip, rhinorrhea, sinus pressure, sneezing, sore throat and trouble swallowing.   Eyes: Negative for redness and itching.  Respiratory: Negative for cough, chest tightness, shortness of breath and wheezing.   Cardiovascular: Negative for palpitations and leg swelling.  Gastrointestinal: Negative for nausea and vomiting.  Genitourinary: Negative for dysuria.  Musculoskeletal: Negative for joint swelling.  Skin: Negative for rash.  Neurological: Negative for headaches.  Hematological: Does not bruise/bleed easily.  Psychiatric/Behavioral: Negative for dysphoric mood. The patient is not nervous/anxious.     Past Medical History:  Diagnosis Date  . Barrett esophagus   . Cancer ([place])   . Hypercholesteremia   . Hypertension   . Tobacco user      Family History  Problem Relation Age of Onset  . CVA Mother   . Lung cancer Father      Social History   Socioeconomic History  . Marital status: Married    Spouse name: Not on file  . Number of children: Not on file  . Years of education: Not on file  . Highest education level: Not on file  Occupational History  . Not on file  Social Needs  . Financial resource strain: Not on file  .  Food insecurity:    Worry: Not on file    Inability: Not on file  . Transportation needs:    Medical: Not on file    Non-medical: Not on file  Tobacco Use  . Smoking status: Current Every Day Smoker    Packs/day: 0.50    Years: 55.00    Pack years: 27.50  . Smokeless tobacco: Never Used  . Tobacco comment: Pt. is contemplating quitting  Substance and Sexual Activity  . Alcohol use: Yes  . Drug use: No  . Sexual activity: Not on file  Lifestyle  . Physical activity:    Days per week: Not on file    Minutes per session: Not on file  . Stress: Not on file  Relationships  . Social connections:    Talks on phone: Not on file    Gets together: Not on file    Attends religious service: Not on file    Active member of club or organization: Not on file    Attends meetings of clubs or organizations: Not on file    Relationship status: Not on file  . Intimate partner violence:    Fear of current or ex partner: Not on file    Emotionally abused: Not on file    Physically abused: Not on file    Forced sexual activity: Not on file  Other Topics Concern  . Not on file  Social History Narrative  . 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   Allergies  Allergen Reactions  . Oysters [Shellfish Allergy] Nausea And Vomiting and Other (See Comments)    One time reaction according to patient     Outpatient Medications Prior to Visit  Medication Sig Dispense Refill  . atorvastatin (LIPITOR) 80 MG tablet Take 80 mg by mouth daily.  2  . pantoprazole (PROTONIX) 40 MG tablet Take 40 mg by mouth daily. Barrett's Esophagus  2  . telmisartan (MICARDIS) 80 MG tablet Take 40 mg by mouth daily.    . benzonatate (TESSALON) 100 MG capsule Take 1 capsule (100 mg total) by mouth every 8 (eight) hours. 21 capsule 0  . calcipotriene (DOVONOX) 0.005 % cream Apply 1 application topically 2 (two) times daily as needed (for  foot rash).    Marland Kitchen oseltamivir (TAMIFLU) 75 MG capsule Take 1 capsule (75 mg total) by mouth every 12 (twelve) hours. 10 capsule 0   No facility-administered medications prior to visit.         Objective:   Physical Exam Vitals:   01/29/18 1622  BP: 122/86  Pulse: 63  SpO2: 99%  Weight: 215 lb (97.5 kg)  Height: 5' 9.5" (1.765 m)   Gen: Pleasant, well-nourished, in no distress,  normal affect  ENT: No lesions,  mouth clear,  oropharynx clear, no postnasal drip  Neck: No JVD, no stridor  Lungs: No use of accessory muscles, no crackles or wheezes  Cardiovascular: RRR, heart sounds normal, no murmur or gallops, no peripheral edema  Musculoskeletal: No deformities, no cyanosis or clubbing  Neuro: alert, non focal  Skin: Warm, no lesions or rash      Assessment & Plan:  COPD (chronic obstructive pulmonary disease) ([place]) He is overall asymptomatic but based on his CT scan of the chest which shows centrilobular emphysema and his morning cough, clear sputum, I do suspect that he has mild COPD.  We discussed performing pulmonary function testing today to quantify his degree of obstruction.  He agrees with this plan.  I do not think he needs bronchodilators at this time  Tobacco use We discussed strategies for cessation today.  He is currently smoking 10 cigarettes daily, is going to try to cut down to a goal of at least 5 before we try to set a quit date.  We talked about the options for medical therapy including Chantix.  He already has a prescription for this but has not started it.  We will plan to start it when he is ready to set a quit date.  Baltazar Apo, MD, PhD 01/29/2018, 5:16 PM [facility] [phone] or if no answer [phone]

## 2018-01-29 NOTE — Patient Instructions (Signed)
We reviewed your CT scan of the chest today. We will perform pulmonary function testing Congratulations on decreasing your smoking.  Agree with making efforts to cut down further.  Once you have decreased to around 5 cigarettes daily then we should try to make plans to set a quit date.  Chantix will be helpful at that time. Follow with Dr Lamonte Sakai next available with full PFT on the same day

## 2018-01-29 NOTE — Assessment & Plan Note (Signed)
He is overall asymptomatic but based on his CT scan of the chest which shows centrilobular emphysema and his morning cough, clear sputum, I do suspect that he has mild COPD.  We discussed performing pulmonary function testing today to quantify his degree of obstruction.  He agrees with this plan.  I do not think he needs bronchodilators at this time

## 2018-01-29 NOTE — Assessment & Plan Note (Signed)
We discussed strategies for cessation today.  He is currently smoking 10 cigarettes daily, is going to try to cut down to a goal of at least 5 before we try to set a quit date.  We talked about the options for medical therapy including Chantix.  He already has a prescription for this but has not started it.  We will plan to start it when he is ready to set a quit date.

## 2018-04-25 ENCOUNTER — Ambulatory Visit: Payer: Medicare Other | Admitting: Emergency Medicine

## 2018-04-25 ENCOUNTER — Encounter: Payer: Self-pay | Admitting: Emergency Medicine

## 2018-04-25 ENCOUNTER — Ambulatory Visit (INDEPENDENT_AMBULATORY_CARE_PROVIDER_SITE_OTHER): Payer: Medicare Other | Admitting: Emergency Medicine

## 2018-04-25 DIAGNOSIS — Z720 Tobacco use: Secondary | ICD-10-CM | POA: Diagnosis not present

## 2018-04-25 DIAGNOSIS — J449 Chronic obstructive pulmonary disease, unspecified: Secondary | ICD-10-CM

## 2018-04-25 DIAGNOSIS — F17210 Nicotine dependence, cigarettes, uncomplicated: Secondary | ICD-10-CM | POA: Diagnosis not present

## 2018-04-25 LAB — PULMONARY FUNCTION TEST
DL/VA % pred: 75 %
DL/VA: 3.48 ml/min/mmHg/L
DLCO unc % pred: 62 %
DLCO unc: 20.24 ml/min/mmHg
FEF 25-75 Pre: 1.33 L/sec
FEF2575-%Pred-Pre: 58 %
FEV1-%Pred-Pre: 73 %
FEV1-PRE: 2.27 L
FEV1FVC-%PRED-PRE: 91 %
FEV6-%PRED-PRE: 83 %
FEV6-PRE: 3.37 L
FEV6FVC-%Pred-Pre: 105 %
FVC-%Pred-Pre: 79 %
FVC-PRE: 3.41 L
PRE FEV1/FVC RATIO: 67 %
Pre FEV6/FVC Ratio: 99 %
RV % PRED: 39 %
RV: 0.99 L
TLC % PRED: 67 %
TLC: 4.73 L

## 2018-04-25 NOTE — Patient Instructions (Signed)
Your pulmonary function testing shows mild to moderate abnormal airflow due to smoking. We will not start any inhaled medications at this time.  If your breathing changes going forward then we will consider doing so. You will be due for your repeat low-dose CT scan to screen for lung cancer in March 2020.  You will be contacted with the arrangements closer to that date. Follow with Dr. Lamonte Sakai in 1 year or sooner if you have any problems, changes with your breathing.

## 2018-04-25 NOTE — Progress Notes (Signed)
Subjective:    Patient ID: Randy Morton, male    DOB: 04-Feb-1946, 73 y.o.   MRN: [id number]  HPI 73 year old active smoker (40+ pack years, currently 0.5pk/day) with a history of hypertension, GERD with Barrett's esophagus, Prostate CA and surgery, hypercholesterolemia. He is participating in the LDCT screening program, had a CT chest 06/2017 that was RADS 1, did show some subtle evidence for centrilobular emphysema.   He is very active, denies any dyspnea. He has cough every morning with clear mucous.  He is here today to discuss the abnormal CT, the possibility that he has COPD.  ROV 04/25/2018 --Randy Morton is 60, active smoker (40 pack years) with hypertension, GERD and a Barrett's esophagus, prostate cancer (surgery), hyperlipidemia.  He is participating in the lung cancer screening program was found to have some emphysematous changes on his CT, otherwise it was a RADS 1 study.  This prompted [place] to perform pulmonary function testing which she had today.  And I have reviewed shows moderate obstruction with a variable and somewhat truncated inspiratory loop.  Bronchodilator responsiveness was not tested.  He had difficulty completing the lung volume maneuver.  His diffusion capacity was decreased and did not fully correct when adjusted for alveolar volume. He can get SOB w stairs. He minimizes any other sx - no cough or wheeze.     Review of Systems  Constitutional: Negative for fever and unexpected weight change.  HENT: Negative for congestion, dental problem, ear pain, nosebleeds, postnasal drip, rhinorrhea, sinus pressure, sneezing, sore throat and trouble swallowing.   Eyes: Negative for redness and itching.  Respiratory: Negative for cough, chest tightness, shortness of breath and wheezing.   Cardiovascular: Negative for palpitations and leg swelling.  Gastrointestinal: Negative for nausea and vomiting.  Genitourinary: Negative for dysuria.  Musculoskeletal: Negative for joint swelling.   Skin: Negative for rash.  Neurological: Negative for headaches.  Hematological: Does not bruise/bleed easily.  Psychiatric/Behavioral: Negative for dysphoric mood. The patient is not nervous/anxious.     Past Medical History:  Diagnosis Date  . Barrett esophagus   . Cancer ([place])   . Hypercholesteremia   . Hypertension   . Tobacco user      Family History  Problem Relation Age of Onset  . CVA Mother   . Lung cancer Father      Social History   Socioeconomic History  . Marital status: Married    Spouse name: Not on file  . Number of children: Not on file  . Years of education: Not on file  . Highest education level: Not on file  Occupational History  . Not on file  Social Needs  . Financial resource strain: Not on file  . Food insecurity:    Worry: Not on file    Inability: Not on file  . Transportation needs:    Medical: Not on file    Non-medical: Not on file  Tobacco Use  . Smoking status: Current Every Day Smoker    Packs/day: 0.50    Years: 55.00    Pack years: 27.50  . Smokeless tobacco: Never Used  . Tobacco comment: Pt. is contemplating quitting  Substance and Sexual Activity  . Alcohol use: Yes  . Drug use: No  . Sexual activity: Not on file  Lifestyle  . Physical activity:    Days per week: Not on file    Minutes per session: Not on file  . Stress: Not on file  Relationships  . Social connections:  Talks on phone: Not on file    Gets together: Not on file    Attends religious service: Not on file    Active member of club or organization: Not on file    Attends meetings of clubs or organizations: Not on file    Relationship status: Not on file  . Intimate partner violence:    Fear of current or ex partner: Not on file    Emotionally abused: Not on file    Physically abused: Not on file    Forced sexual activity: Not on file  Other Topics Concern  . Not on file  Social History Narrative  . 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   No Known Allergies   Outpatient Medications Prior to Visit  Medication Sig Dispense Refill  . atorvastatin (LIPITOR) 80 MG tablet Take 80 mg by mouth daily.  2  . pantoprazole (PROTONIX) 40 MG tablet Take 40 mg by mouth daily. Barrett's Esophagus  2  . telmisartan (MICARDIS) 80 MG tablet Take 40 mg by mouth daily.     No facility-administered medications prior to visit.         Objective:   Physical Exam Vitals:   04/25/18 1359  BP: 132/88  Pulse: 84  SpO2: 97%  Weight: 98.4 kg  Height: 5\' 10"  (1.778 m)    Gen: Pleasant, well-nourished, in no distress,  normal affect  ENT: No lesions,  mouth clear,  oropharynx clear, no postnasal drip  Neck: No JVD, no stridor  Lungs: No use of accessory muscles, no crackles or wheezes  Cardiovascular: RRR, heart sounds normal, no murmur or gallops, no peripheral edema  Musculoskeletal: No deformities, no cyanosis or clubbing  Neuro: alert, non focal  Skin: Warm, no lesions or rash      Assessment & Plan:  Tobacco use still smoking.  I counseled him on the benefits of cessation today.  He is ready to set a quit date.  Participating in the lung cancer screening program and due for his repeat scan in March 2020  COPD (chronic obstructive pulmonary disease) ([facility]) Moderate obstruction confirmed on his pulmonary function testing today.  That being said he minimizes symptoms, denies any dyspnea although he is somewhat sedentary.  No cough, no wheeze.  He does not want to be on bronchodilators at this time.  I talked to him about the possibility that we may decide to do so at some point in the future.  Baltazar Apo, MD, PhD 04/25/2018, 2:18 PM [facility] [phone] or if no answer [phone]

## 2018-04-25 NOTE — Assessment & Plan Note (Signed)
Moderate obstruction confirmed on his pulmonary function testing today.  That being said he minimizes symptoms, denies any dyspnea although he is somewhat sedentary.  No cough, no wheeze.  He does not want to be on bronchodilators at this time.  I talked to him about the possibility that we may decide to do so at some point in the future.

## 2018-04-25 NOTE — Assessment & Plan Note (Addendum)
still smoking.  I counseled him on the benefits of cessation today.  He is ready to set a quit date.  Participating in the lung cancer screening program and due for his repeat scan in March 2020

## 2018-04-25 NOTE — Progress Notes (Addendum)
Patient completed full PFT today. Patient refused to do the post spiro testing today. Pt refused to complete the post spiro part of the PFT.

## 2018-05-07 DIAGNOSIS — I10 Essential (primary) hypertension: Secondary | ICD-10-CM | POA: Diagnosis not present

## 2018-05-07 DIAGNOSIS — E7800 Pure hypercholesterolemia, unspecified: Secondary | ICD-10-CM | POA: Diagnosis not present

## 2018-05-07 DIAGNOSIS — Z125 Encounter for screening for malignant neoplasm of prostate: Secondary | ICD-10-CM | POA: Diagnosis not present

## 2018-05-10 DIAGNOSIS — K219 Gastro-esophageal reflux disease without esophagitis: Secondary | ICD-10-CM | POA: Diagnosis not present

## 2018-05-10 DIAGNOSIS — I10 Essential (primary) hypertension: Secondary | ICD-10-CM | POA: Diagnosis not present

## 2018-05-10 DIAGNOSIS — Z0000 Encounter for general adult medical examination without abnormal findings: Secondary | ICD-10-CM | POA: Diagnosis not present

## 2018-05-10 DIAGNOSIS — Z8719 Personal history of other diseases of the digestive system: Secondary | ICD-10-CM | POA: Diagnosis not present

## 2018-05-13 ENCOUNTER — Emergency Department (HOSPITAL_COMMUNITY)
Admission: EM | Admit: 2018-05-13 | Discharge: 2018-05-13 | Disposition: A | Discharge status: Home / Self Care | Payer: Medicare Other | Attending: Emergency Medicine | Admitting: Emergency Medicine

## 2018-05-13 ENCOUNTER — Encounter (HOSPITAL_COMMUNITY): Payer: Self-pay

## 2018-05-13 ENCOUNTER — Emergency Department (HOSPITAL_COMMUNITY): Payer: Medicare Other

## 2018-05-13 ENCOUNTER — Other Ambulatory Visit: Payer: Self-pay

## 2018-05-13 DIAGNOSIS — I9589 Other hypotension: Secondary | ICD-10-CM | POA: Diagnosis not present

## 2018-05-13 DIAGNOSIS — Z79899 Other long term (current) drug therapy: Secondary | ICD-10-CM | POA: Diagnosis not present

## 2018-05-13 DIAGNOSIS — Z859 Personal history of malignant neoplasm, unspecified: Secondary | ICD-10-CM | POA: Diagnosis not present

## 2018-05-13 DIAGNOSIS — J101 Influenza due to other identified influenza virus with other respiratory manifestations: Secondary | ICD-10-CM | POA: Insufficient documentation

## 2018-05-13 DIAGNOSIS — R0602 Shortness of breath: Secondary | ICD-10-CM | POA: Diagnosis not present

## 2018-05-13 DIAGNOSIS — R0902 Hypoxemia: Secondary | ICD-10-CM | POA: Diagnosis not present

## 2018-05-13 DIAGNOSIS — R531 Weakness: Secondary | ICD-10-CM | POA: Diagnosis not present

## 2018-05-13 DIAGNOSIS — Z7982 Long term (current) use of aspirin: Secondary | ICD-10-CM | POA: Insufficient documentation

## 2018-05-13 DIAGNOSIS — F17210 Nicotine dependence, cigarettes, uncomplicated: Secondary | ICD-10-CM | POA: Insufficient documentation

## 2018-05-13 DIAGNOSIS — R51 Headache: Secondary | ICD-10-CM | POA: Diagnosis not present

## 2018-05-13 DIAGNOSIS — J111 Influenza due to unidentified influenza virus with other respiratory manifestations: Secondary | ICD-10-CM | POA: Diagnosis not present

## 2018-05-13 DIAGNOSIS — R05 Cough: Secondary | ICD-10-CM | POA: Diagnosis not present

## 2018-05-13 DIAGNOSIS — I10 Essential (primary) hypertension: Secondary | ICD-10-CM | POA: Insufficient documentation

## 2018-05-13 DIAGNOSIS — Z0389 Encounter for observation for other suspected diseases and conditions ruled out: Secondary | ICD-10-CM | POA: Diagnosis not present

## 2018-05-13 DIAGNOSIS — I714 Abdominal aortic aneurysm, without rupture, unspecified: Secondary | ICD-10-CM | POA: Insufficient documentation

## 2018-05-13 DIAGNOSIS — R5383 Other fatigue: Secondary | ICD-10-CM | POA: Diagnosis not present

## 2018-05-13 DIAGNOSIS — J449 Chronic obstructive pulmonary disease, unspecified: Secondary | ICD-10-CM | POA: Diagnosis not present

## 2018-05-13 DIAGNOSIS — I4510 Unspecified right bundle-branch block: Secondary | ICD-10-CM | POA: Diagnosis not present

## 2018-05-13 DIAGNOSIS — I959 Hypotension, unspecified: Secondary | ICD-10-CM | POA: Diagnosis not present

## 2018-05-13 HISTORY — DX: Chronic obstructive pulmonary disease, unspecified: J44.9

## 2018-05-13 HISTORY — DX: Abdominal aortic aneurysm, without rupture: I71.4

## 2018-05-13 HISTORY — DX: Abdominal aortic aneurysm, without rupture, unspecified: I71.40

## 2018-05-13 LAB — CBC WITH DIFFERENTIAL/PLATELET
Abs Immature Granulocytes: 0.02 10*3/uL (ref 0.00–0.07)
BASOS ABS: 0 10*3/uL (ref 0.0–0.1)
BASOS PCT: 1 %
EOS ABS: 0.1 10*3/uL (ref 0.0–0.5)
Eosinophils Relative: 2 %
HCT: 41.6 % (ref 39.0–52.0)
Hemoglobin: 13.6 g/dL (ref 13.0–17.0)
Immature Granulocytes: 0 %
Lymphocytes Relative: 7 %
Lymphs Abs: 0.4 10*3/uL — ABNORMAL LOW (ref 0.7–4.0)
MCH: 30.8 pg (ref 26.0–34.0)
MCHC: 32.7 g/dL (ref 30.0–36.0)
MCV: 94.3 fL (ref 80.0–100.0)
Monocytes Absolute: 0.6 10*3/uL (ref 0.1–1.0)
Monocytes Relative: 9 %
NEUTROS PCT: 81 %
NRBC: 0 % (ref 0.0–0.2)
Neutro Abs: 5.2 10*3/uL (ref 1.7–7.7)
Platelets: 158 10*3/uL (ref 150–400)
RBC: 4.41 MIL/uL (ref 4.22–5.81)
RDW: 13.6 % (ref 11.5–15.5)
WBC: 6.4 10*3/uL (ref 4.0–10.5)

## 2018-05-13 LAB — HEPATIC FUNCTION PANEL
ALK PHOS: 51 U/L (ref 38–126)
ALT: 32 U/L (ref 0–44)
AST: 30 U/L (ref 15–41)
Albumin: 3.6 g/dL (ref 3.5–5.0)
BILIRUBIN DIRECT: 0.2 mg/dL (ref 0.0–0.2)
BILIRUBIN INDIRECT: 0.7 mg/dL (ref 0.3–0.9)
TOTAL PROTEIN: 6.1 g/dL — AB (ref 6.5–8.1)
Total Bilirubin: 0.9 mg/dL (ref 0.3–1.2)

## 2018-05-13 LAB — URINALYSIS, ROUTINE W REFLEX MICROSCOPIC
Bacteria, UA: NONE SEEN
Glucose, UA: NEGATIVE mg/dL
Hgb urine dipstick: NEGATIVE
KETONES UR: NEGATIVE mg/dL
Leukocytes, UA: NEGATIVE
NITRITE: NEGATIVE
PH: 5 (ref 5.0–8.0)
Protein, ur: 100 mg/dL — AB
SPECIFIC GRAVITY, URINE: 1.028 (ref 1.005–1.030)

## 2018-05-13 LAB — BASIC METABOLIC PANEL
ANION GAP: 9 (ref 5–15)
BUN: 20 mg/dL (ref 8–23)
CHLORIDE: 104 mmol/L (ref 98–111)
CO2: 23 mmol/L (ref 22–32)
Calcium: 8.4 mg/dL — ABNORMAL LOW (ref 8.9–10.3)
Creatinine, Ser: 1.55 mg/dL — ABNORMAL HIGH (ref 0.61–1.24)
GFR calc Af Amer: 50 mL/min — ABNORMAL LOW (ref >60)
GFR, EST NON AFRICAN AMERICAN: 43 mL/min — AB (ref >60)
Glucose, Bld: 97 mg/dL (ref 70–99)
POTASSIUM: 3.7 mmol/L (ref 3.5–5.1)
SODIUM: 136 mmol/L (ref 135–145)

## 2018-05-13 LAB — LACTIC ACID, PLASMA: Lactic Acid, Venous: 0.9 mmol/L (ref 0.5–1.9)

## 2018-05-13 LAB — INFLUENZA PANEL BY PCR (TYPE A & B)
INFLBPCR: NEGATIVE
Influenza A By PCR: POSITIVE — AB

## 2018-05-13 LAB — CBG MONITORING, ED: GLUCOSE-CAPILLARY: 102 mg/dL — AB (ref 70–99)

## 2018-05-13 MED ORDER — OSELTAMIVIR PHOSPHATE 30 MG PO CAPS: 30.0000 mg | ORAL_CAPSULE | Freq: Two times a day (BID) | ORAL | 0 refills | Status: DC

## 2018-05-13 MED ORDER — OSELTAMIVIR PHOSPHATE 30 MG PO CAPS
30.0000 mg | ORAL_CAPSULE | Freq: Once | ORAL | Status: DC
  Filled 2018-05-13: qty 1

## 2018-05-13 MED ORDER — SODIUM CHLORIDE 0.9 % IV BOLUS
500.0000 mL | Freq: Once | INTRAVENOUS | Status: AC
  Administered 2018-05-13: 500 mL via INTRAVENOUS

## 2018-05-13 MED ORDER — ACETAMINOPHEN 500 MG PO TABS
1000.0000 mg | ORAL_TABLET | Freq: Once | ORAL | Status: AC
  Administered 2018-05-13: 1000 mg via ORAL
  Filled 2018-05-13: qty 2

## 2018-05-13 MED ORDER — OSELTAMIVIR PHOSPHATE 30 MG PO CAPS: 30.0000 mg | ORAL_CAPSULE | Freq: Two times a day (BID) | ORAL | 0 refills | Status: AC

## 2018-05-13 NOTE — Discharge Instructions (Addendum)
You can start taking Tamiflu as prescribed.  If you develop any mood disturbances or other unpleasant side effects you can stop taking it. Tamiflu can cause nausea, take zofran as needed.  Drink plenty of water and get plenty of rest. Alternate 600 mg of ibuprofen and [phone] mg of Tylenol every 3 hours as needed for pain/fever. Do not exceed 4000 mg of Tylenol daily.  Take ibuprofen with food to avoid upset stomach issues.  You can use over-the-counter medications such as Mucinex for your cough.  Allergy medicines such as Zyrtec or Allegra can be helpful for scratchy throat and nasal congestion.  You can also use Flonase for nasal congestion.  Use warm water salt gargles, warm teas, throat lozenges/cough drops, and over-the-counter Chloraseptic spray for sore throat.  Follow-up with your primary care doctor if symptoms persist.  Return to the emergency department if any concerning signs or symptoms develop such as persistent shortness of breath, chest pains, persistent vomiting, or severe abdominal pains.

## 2018-05-13 NOTE — ED Notes (Signed)
Pt steady on feet and did not need assistance to ambulate.

## 2018-05-13 NOTE — ED Triage Notes (Signed)
He c/o cough plus shortness of breath and occasional chills since yesterday evening. His wife states that pt. Was "mildly incoherent" for a brief interval today, although he did recognize her, some of his replies to queries were inconsistent with the question(s) asked. He was seen at his pcp today, who phoned EMS when pt. Was found to be hypoxemic. He arrives in E.D. in no distress. His wife and son are with him. [place].

## 2018-05-13 NOTE — ED Provider Notes (Signed)
[facility] DEPT Provider Note   CSN: [id number] Arrival date & time: 05/13/18  1351     History   Chief Complaint Chief Complaint  Patient presents with  . Shortness of Breath    HPI [name] is a 73 y.o. male with a history of hypertension, AAA, early stages of COPD, tobacco abuse, prostatectomy presenting to the emergency department with chief complaint of shortness of breath x 2 days.   Patient states 2 days ago he started to feel short of breath while sitting on the couch.  He also has a nonproductive cough, generalized body aches, and headache. He describes the headache as being located in his forehead and temples.  Describes the pain as a pressure that has been constant.  He describes the pain as 8 out of 10.  He took Tylenol  with minimal relief.   Patient has a history of headaches but has not had one in a while and is unable to tell if this is similar to ones he had in the past.  He went to urgent care prior to arrival and was found to have SpO2 of 21% with systolic in the 30Q. Spouse reports he has had episodes of confusion. His rapid flu test there was negative. Denies visual changes, neck pain, nausea, vomiting, chest pain, rash. History provided by patient and spouse.  Past Medical History:  Diagnosis Date  . Abdominal aortic aneurysm (AAA) (HCC)    Known 1.5cm AAA  . Barrett esophagus   . Cancer ([place])   . COPD (chronic obstructive pulmonary disease) ([place])   . Hypercholesteremia   . Hypertension   . Tobacco user     Patient Active Problem List   Diagnosis Date Noted  . Abdominal aortic aneurysm (AAA) ([place])   . COPD (chronic obstructive pulmonary disease) ([place]) 01/29/2018  . Tobacco use 01/29/2018    Past Surgical History:  Procedure Laterality Date  . ADENOIDECTOMY    . PROSTATECTOMY    . TONSILLECTOMY          Home Medications    Prior to Admission medications   Medication Sig Start Date End Date Taking?  Authorizing Provider  aspirin 81 MG chewable tablet Chew 162 mg by mouth daily.   Yes [provider]  atorvastatin (LIPITOR) 40 MG tablet Take 40 mg by mouth daily.  05/18/17  Yes [provider]  pantoprazole (PROTONIX) 40 MG tablet Take 40 mg by mouth daily. Barrett's Esophagus 05/16/17  Yes [provider]  telmisartan (MICARDIS) 80 MG tablet Take 40 mg by mouth daily.   Yes [provider]  oseltamivir (TAMIFLU) 30 MG capsule Take 1 capsule (30 mg total) by mouth 2 (two) times daily for 5 days. 05/13/18 05/18/18  Quintella Reichert, MD    Family History Family History  Problem Relation Age of Onset  . CVA Mother   . Lung cancer Father     Social History Social History   Tobacco Use  . Smoking status: Current Every Day Smoker    Packs/day: 0.50    Years: 55.00    Pack years: 27.50  . Smokeless tobacco: Never Used  . Tobacco comment: Pt. is contemplating quitting  Substance Use Topics  . Alcohol use: Yes  . Drug use: No     Allergies   Patient has no known allergies.   Review of Systems Review of Systems  Constitutional: Negative for chills and fever.  HENT: Positive for congestion. Negative for sinus pressure and  sore throat.   Eyes: Negative for pain and visual disturbance.  Respiratory: Positive for cough. Negative for chest tightness, shortness of breath and wheezing.   Cardiovascular: Negative for chest pain and palpitations.  Gastrointestinal: Negative for abdominal pain, diarrhea, nausea and vomiting.  Genitourinary: Negative for difficulty urinating and hematuria.  Musculoskeletal: Negative for back pain and neck pain.  Skin: Negative for rash and wound.  Neurological: Positive for headaches. Negative for dizziness and syncope.  Psychiatric/Behavioral: Positive for confusion.     Physical Exam Updated Vital Signs BP 137/88   Pulse 83   Temp 99.3 F (37.4 C) (Oral)   Resp (!) 25   Ht 5' 9.5" (1.765 m)   Wt 96.2 kg    SpO2 93%   BMI 30.86 kg/m   Physical Exam Vitals signs and nursing note reviewed.  Constitutional:      Appearance: He is not ill-appearing or toxic-appearing.  HENT:     Head: Normocephalic and atraumatic.     Comments: Pt is non tender over bilateral temples. No sinus tenderness.    Nose: Nose normal.     Mouth/Throat:     Mouth: Mucous membranes are dry.     Pharynx: Oropharynx is clear.  Eyes:     General: No scleral icterus.    Extraocular Movements: Extraocular movements intact.     Conjunctiva/sclera: Conjunctivae normal.     Pupils: Pupils are equal, round, and reactive to light.  Neck:     Musculoskeletal: Normal range of motion. No muscular tenderness.     Vascular: No JVD.  Cardiovascular:     Rate and Rhythm: Normal rate and regular rhythm.     Pulses: Normal pulses.     Heart sounds: Normal heart sounds.  Pulmonary:     Effort: Pulmonary effort is normal.     Breath sounds: Normal breath sounds.  Abdominal:     General: There is no distension.     Palpations: Abdomen is soft.     Tenderness: There is no abdominal tenderness. There is no guarding or rebound.  Musculoskeletal: Normal range of motion.     Right lower leg: No edema.     Left lower leg: No edema.  Skin:    General: Skin is warm and dry.     Capillary Refill: Capillary refill takes less than 2 seconds.  Neurological:     Mental Status: He is alert. Mental status is at baseline.     Motor: No weakness.     Comments: Speech is clear and goal oriented, follows commands CN III-XII intact, no facial droop Normal strength in upper and lower extremities bilaterally including dorsiflexion and plantar flexion, strong and equal grip strength Sensation normal to light and sharp touch Moves extremities without ataxia, coordination intact Normal finger to nose and rapid alternating movements Normal gait and balance   Psychiatric:        Behavior: Behavior normal.      ED Treatments / Results   Labs (all labs ordered are listed, but only abnormal results are displayed) Labs Reviewed  CBC WITH DIFFERENTIAL/PLATELET - Abnormal; Notable for the following components:      Result Value   Lymphs Abs 0.4 (*)    All other components within normal limits  URINALYSIS, ROUTINE W REFLEX MICROSCOPIC - Abnormal; Notable for the following components:   Color, Urine AMBER (*)    APPearance HAZY (*)    Bilirubin Urine MODERATE (*)    Protein, ur 100 (*)  All other components within normal limits  HEPATIC FUNCTION PANEL - Abnormal; Notable for the following components:   Total Protein 6.1 (*)    All other components within normal limits  INFLUENZA PANEL BY PCR (TYPE A & B) - Abnormal; Notable for the following components:   Influenza A By PCR POSITIVE (*)    All other components within normal limits  BASIC METABOLIC PANEL - Abnormal; Notable for the following components:   Creatinine, Ser 1.55 (*)    Calcium 8.4 (*)    GFR calc non Af Amer 43 (*)    GFR calc Af Amer 50 (*)    All other components within normal limits  CBG MONITORING, ED - Abnormal; Notable for the following components:   Glucose-Capillary 102 (*)    All other components within normal limits  CULTURE, BLOOD (ROUTINE X 2)  CULTURE, BLOOD (ROUTINE X 2)  LACTIC ACID, PLASMA  LACTIC ACID, PLASMA    EKG EKG Interpretation  Date/Time:  Sunday May 13 2018 14:05:47 EST Ventricular Rate:  93 PR Interval:    QRS Duration: 117 QT Interval:  367 QTC Calculation: 457 R Axis:   67 Text Interpretation:  Sinus rhythm Incomplete right bundle branch block Borderline ST elevation, anterior leads no prior available for comparison Confirmed by Quintella Reichert [phone]) on 05/13/2018 5:53:34 PM   Radiology Dg Chest 2 View  Result Date: 05/13/2018 CLINICAL DATA:  73 year old male with history of cough and shortness of breath. EXAM: CHEST - 2 VIEW COMPARISON:  Chest x-ray 05/20/2017. FINDINGS: Lung volumes are normal. No  consolidative airspace disease. No pleural effusions. No pneumothorax. No pulmonary nodule or mass noted. Pulmonary vasculature and the cardiomediastinal silhouette are within normal limits. IMPRESSION: No radiographic evidence of acute cardiopulmonary disease. Electronically Signed   By: Vinnie Langton M.D.   On: 05/13/2018 16:25   Ct Head Wo Contrast  Result Date: 05/13/2018 CLINICAL DATA:  Possible sepsis EXAM: CT HEAD WITHOUT CONTRAST TECHNIQUE: Contiguous axial images were obtained from the base of the skull through the vertex without intravenous contrast. COMPARISON:  None. FINDINGS: Brain: No evidence of acute infarction, hemorrhage, hydrocephalus, extra-axial collection or mass lesion/mass effect. Vascular: No hyperdense vessel or unexpected calcification. Skull: Normal. Negative for fracture or focal lesion. Sinuses/Orbits: No acute finding. Other: None. IMPRESSION: Normal head CT Electronically Signed   By: Inez Catalina M.D.   On: 05/13/2018 19:16    Procedures Procedures (including critical care time)  Medications Ordered in ED Medications  oseltamivir (TAMIFLU) capsule 30 mg (30 mg Oral Not Given 05/13/18 2059)  sodium chloride 0.9 % bolus 500 mL (0 mLs Intravenous Stopped 05/13/18 1842)  acetaminophen (TYLENOL) tablet 1,000 mg (1,000 mg Oral Given 05/13/18 1610)     Initial Impression / Assessment and Plan / ED Course  I have reviewed the triage vital signs and the nursing notes.  Pertinent labs & imaging results that were available during my care of the patient were reviewed by me and considered in my medical decision making (see chart for details).   Pt is non toxic appearing, afebrile.  Given his increased confusion, subjective fevers with unknown source will initiate work-up with UA, CBC, BMP, influenza, blood cultures, chest xray, head CT. Looking back at his orders, CMP should have been ordered, will add hepatic function panel.  I viewed his chest x-ray is not suggestive of  acute infectious process.  His head CT is negative. CBC and hepatic function panel unremarkable, UA does not suggest UTI. BMP with  elevated Creatinine to 1.55 compared to 11 months ago of 1.33. Influenza A positive. Will renally dose tamiflu given elevated Creatinine. His creatinine clearance is 57 mL/min. Pt given dose of tamiflu here and prescription sent to his pharmacy at discharge. Pt ambulated without assistance and would like to be discharged home. Discussed strict ED return precautions. Pt verbalized understanding of and is in agreement with this plan. Pt stable for discharge home at this time. The patient was discussed with and seen by Dr. Ralene Bathe who agrees with the treatment plan.   Final Clinical Impressions(s) / ED Diagnoses   Final diagnoses:  Influenza    ED Discharge Orders         Ordered    oseltamivir (TAMIFLU) 30 MG capsule  2 times daily,   Status:  Discontinued     05/13/18 2056    oseltamivir (TAMIFLU) 30 MG capsule  2 times daily     05/13/18 2156           Cherre Robins, PA-C 05/14/18 1119    Francine Graven, DO 05/14/18 1510

## 2018-05-18 LAB — CULTURE, BLOOD (ROUTINE X 2)
CULTURE: NO GROWTH
CULTURE: NO GROWTH

## 2018-06-14 DIAGNOSIS — N5201 Erectile dysfunction due to arterial insufficiency: Secondary | ICD-10-CM | POA: Diagnosis not present

## 2018-06-14 DIAGNOSIS — Z8546 Personal history of malignant neoplasm of prostate: Secondary | ICD-10-CM | POA: Diagnosis not present

## 2018-06-14 DIAGNOSIS — N486 Induration penis plastica: Secondary | ICD-10-CM | POA: Diagnosis not present

## 2018-09-03 ENCOUNTER — Ambulatory Visit: Payer: Medicare Other | Admitting: Urology

## 2018-09-03 ENCOUNTER — Encounter: Payer: Self-pay | Admitting: Urology

## 2018-11-22 ENCOUNTER — Telehealth: Payer: Self-pay | Admitting: Cardiology

## 2018-12-05 ENCOUNTER — Other Ambulatory Visit: Payer: Self-pay

## 2018-12-05 DIAGNOSIS — Z20822 Contact with and (suspected) exposure to covid-19: Secondary | ICD-10-CM

## 2018-12-06 LAB — NOVEL CORONAVIRUS, NAA: SARS-CoV-2, NAA: NOT DETECTED

## 2019-01-10 ENCOUNTER — Telehealth: Payer: Self-pay | Admitting: Cardiology

## 2019-01-10 NOTE — Progress Notes (Deleted)
Primary Physician:  Deland Pretty, MD   Patient ID: Randy Morton, male    DOB: 04/18/1945, 74 y.o.   MRN: [id number]  Subjective:    No chief complaint on file.   HPI: Randy Morton  is a 73 y.o. male  with history of hypertension, hyperlipidemia, GERD, Barrett's esophagus referred to me for evaluation of coronary artery disease. Recently he underwent CT scan of the chest for lung cancer screening, which revealed aortic atherosclerosis as well as atherosclerosis of the great vessels of the mediastinum and also coronary arteries fortunately did not reveal any malignant lesions, but mild centrilobular emphysema and paraseptal emphysema and a left adrenal myelolipoma.  He underwent routine treadmill stress testing that was considered low risk on 10/02/2017. Echocardiogram on 09/20/2017 showed moderate LVH with normal LVEF and moderate MR, otherwise normal echocardiogram. Abdominal duplex on 09/20/2017 revealed a small abdominal aortic aneurysm measuring 2.9 cm in the proximal aorta with diffuse plaque. I have discussed importance of smoking cessation and he was started on Chantix at his last office visit and now presents for 1 month follow-up. Wife is present at bedside.  Patient reports that he did not start Chantix as he felt he could quit smoking on his own and has significantly cut back to half a pack per day. He does not exercise, but does play golf regularly and is active with maintaining his yard. Wife is very concerned regarding emphysema that was noted on CT scan and is requesting referral to pulmonology. Patient is essentially asymptomatic. Blood pressure is much better controlled now that he is back on Telmisartan.  Past Medical History:  Diagnosis Date  . Abdominal aortic aneurysm (AAA) (HCC)    Known 1.5cm AAA  . Barrett esophagus   . Cancer ([place])   . COPD (chronic obstructive pulmonary disease) ([place])   . Hypercholesteremia   . Hypertension   . Tobacco user      Past Surgical History:  Procedure Laterality Date  . ADENOIDECTOMY    . PROSTATECTOMY    . TONSILLECTOMY      Social History   Socioeconomic History  . Marital status: Married    Spouse name: Not on file  . Number of children: Not on file  . Years of education: Not on file  . Highest education level: Not on file  Occupational History  . Not on file  Social Needs  . Financial resource strain: Not on file  . Food insecurity    Worry: Not on file    Inability: Not on file  . Transportation needs    Medical: Not on file    Non-medical: Not on file  Tobacco Use  . Smoking status: Current Every Day Smoker    Packs/day: 0.50    Years: 55.00    Pack years: 27.50  . Smokeless tobacco: Never Used  . Tobacco comment: Pt. is contemplating quitting  Substance and Sexual Activity  . Alcohol use: Yes  . Drug use: No  . Sexual activity: Not on file  Lifestyle  . Physical activity    Days per week: Not on file    Minutes per session: Not on file  . Stress: Not on file  Relationships  . Social Herbalist on phone: Not on file    Gets together: Not on file    Attends religious service: Not on file    Active member of club or organization: Not on file    Attends meetings of clubs or  organizations: Not on file    Relationship status: Not on file  . Intimate partner violence    Fear of current or ex partner: Not on file    Emotionally abused: Not on file    Physically abused: Not on file    Forced sexual activity: Not on file  Other Topics Concern  . Not on file  Social History Narrative  . Not on file    Review of Systems  Constitution: Negative for decreased appetite, malaise/fatigue, weight gain and weight loss.  Eyes: Negative for visual disturbance.  Cardiovascular: Negative for chest pain, claudication, dyspnea on exertion, leg swelling, orthopnea, palpitations and syncope.  Respiratory: Positive for cough. Negative for hemoptysis and wheezing.   Endocrine:  Negative for cold intolerance and heat intolerance.  Hematologic/Lymphatic: Does not bruise/bleed easily.  Skin: Negative for nail changes.  Musculoskeletal: Negative for muscle weakness and myalgias.  Gastrointestinal: Negative for abdominal pain, change in bowel habit, nausea and vomiting.  Neurological: Negative for difficulty with concentration, dizziness, focal weakness and headaches.  Psychiatric/Behavioral: Negative for altered mental status and suicidal ideas.  All other systems reviewed and are negative.     Objective:  There were no vitals taken for this visit. There is no height or weight on file to calculate BMI.    Physical Exam  Constitutional: He is oriented to person, place, and time. Vital signs are normal. He appears well-developed and well-nourished.  HENT:  Head: Normocephalic and atraumatic.  Neck: Normal range of motion.  Cardiovascular: Normal rate, regular rhythm, normal heart sounds and intact distal pulses.  Pulses:      Femoral pulses are 2+ on the right side and 2+ on the left side.      Popliteal pulses are 2+ on the right side and 2+ on the left side.       Dorsalis pedis pulses are 2+ on the right side and 0 on the left side.       Posterior tibial pulses are 2+ on the right side and 2+ on the left side.  Pulmonary/Chest: Effort normal and breath sounds normal. No accessory muscle usage. No respiratory distress.  Abdominal: Soft. Bowel sounds are normal.  Musculoskeletal: Normal range of motion.  Neurological: He is alert and oriented to person, place, and time.  Skin: Skin is warm and dry.  Vitals reviewed.  Radiology:  CT scan of the chest 06/23/2017: Aortic atherosclerosis with three-vessel coronary occasion in LAD, left circumflex, and right coronary artery. 3.7 x 3.5 cm fatty attenuation mass in left adrenal gland compatible with adrenal myolipoma. Mild diffuse bronchial wall thickening with mild central lobular and paraseptal emphysema imaging  findings suggestive of underlying COPD. No suspicious appearing pulmonary nodules or masses.  Laboratory examination:   *** CMP Latest Ref Rng & Units 05/13/2018 05/20/2017 03/20/2008  Glucose 70 - 99 mg/dL 97 92 112(H)  BUN 8 - 23 mg/dL 20 19 15   Creatinine 0.61 - 1.24 mg/dL 1.55(H) 1.33(H) 1.06  Sodium 135 - 145 mmol/L 136 134(L) 138  Potassium 3.5 - 5.1 mmol/L 3.7 4.0 4.1  Chloride 98 - 111 mmol/L 104 99(L) 103  CO2 22 - 32 mmol/L 23 23 29   Calcium 8.9 - 10.3 mg/dL 8.4(L) 9.0 9.4  Total Protein 6.5 - 8.1 g/dL 6.1(L) - -  Total Bilirubin 0.3 - 1.2 mg/dL 0.9 - -  Alkaline Phos 38 - 126 U/L 51 - -  AST 15 - 41 U/L 30 - -  ALT 0 - 44 U/L 32 - -  CBC Latest Ref Rng & Units 05/13/2018 05/20/2017 03/25/2008  WBC 4.0 - 10.5 K/uL 6.4 5.7 -  Hemoglobin 13.0 - 17.0 g/dL 13.6 15.3 12.4(L)  Hematocrit 39.0 - 52.0 % 41.6 43.5 36.6(L)  Platelets 150 - 400 K/uL 158 105(L) -   Lipid Panel  No results found for: CHOL, TRIG, HDL, CHOLHDL, VLDL, LDLCALC, LDLDIRECT HEMOGLOBIN A1C No results found for: HGBA1C, MPG TSH No results for input(s): TSH in the last 8760 hours.  PRN Meds:. There are no discontinued medications. No outpatient medications have been marked as taking for the 01/10/19 encounter (Appointment) with Miquel Dunn, NP.    Cardiac Studies:    Exercise Treadmill Stress Test 10/02/2017: Indication: Coronary Artery Calcification  The patient exercised on Bruce protocol for 6:24 min. Patient achieved 7.55 METS and reached HR 136 bpm, which is 92% of maximum age-predicted HR. Stress test terminated due to fatigue.  Exercise capacity was low normal. HR Response to Exercise: Appropriate. BP Response to Exercise: Normal resting BP- appropriate response. Chest Pain: none. Arrhythmias: Rare PAC's ST Changes: With peak exercise there was no ST-T changes of ischemia.  Overall Impression: Normal stress test. Continue primary/secondary prevention.  Abdominal aortic duplex  09/20/2017: An abdominal aortic aneurysm measuring 2.9 x 2.93 x 2.99 cm is seen in the proximal aorta. Diffuse calcific plaque observed in the proximal, mid and distal aorta. Bilateral triphasic normal flow velocities noted in iliac arteries. Recheck in 2 years.  Echocardiogram 09/20/2017: Left ventricle cavity is normal in size. Moderate concentric hypertrophy of the left ventricle. Normal global wall motion. Normal diastolic filling pattern. Calculated EF 55%. Moderate (Grade II) mitral regurgitation. Mild tricuspid regurgitation. No evidence of pulmonary hypertension.  Assessment:   No diagnosis found.  ***  Recommendations:   Mr. Randy Morton is a Caucasian male with history of hypertension, hyperlipidemia, GERD, Barrett's esophagus referred to me for evaluation of coronary artery disease. Recently he underwent CT scan of the chest for lung cancer screening, which revealed aortic atherosclerosis as well as atherosclerosis of the great vessels of the mediastinum and also coronary arteries fortunately did not reveal any malignant lesions but mild centrilobular emphysema and paraseptal emphysema and a left adrenal myelolipoma.  Miquel Dunn, MSN, APRN, FNP-C [facility]. [place] Office: [phone] Fax: [phone]

## 2019-01-15 ENCOUNTER — Telehealth: Payer: Self-pay | Admitting: Cardiology

## 2019-01-15 NOTE — Progress Notes (Deleted)
Primary Physician:  Deland Pretty, MD   Patient ID: Randy Morton, male    DOB: 06/18/1945, 73 y.o.   MRN: [id number]  Subjective:    No chief complaint on file.   HPI: Randy Morton  is a 73 y.o. male  with history of hypertension, hyperlipidemia, GERD, Barrett's esophagus referred to me for evaluation of coronary artery disease. Recently he underwent CT scan of the chest for lung cancer screening, which revealed aortic atherosclerosis as well as atherosclerosis of the great vessels of the mediastinum and also coronary arteries fortunately did not reveal any malignant lesions, but mild centrilobular emphysema and paraseptal emphysema and a left adrenal myelolipoma.  He underwent routine treadmill stress testing that was considered low risk on 10/02/2017. Echocardiogram on 09/20/2017 showed moderate LVH with normal LVEF and moderate MR, otherwise normal echocardiogram. Abdominal duplex on 09/20/2017 revealed a small abdominal aortic aneurysm measuring 2.9 cm in the proximal aorta with diffuse plaque. I have discussed importance of smoking cessation and he was started on Chantix at his last office visit and now presents for 1 month follow-up. Wife is present at bedside.  Patient reports that he did not start Chantix as he felt he could quit smoking on his own and has significantly cut back to half a pack per day. He does not exercise, but does play golf regularly and is active with maintaining his yard. Wife is very concerned regarding emphysema that was noted on CT scan and is requesting referral to pulmonology. Patient is essentially asymptomatic. Blood pressure is much better controlled now that he is back on Telmisartan.  Past Medical History:  Diagnosis Date  . Abdominal aortic aneurysm (AAA) (HCC)    Known 1.5cm AAA  . Barrett esophagus   . Cancer ([place])   . COPD (chronic obstructive pulmonary disease) ([place])   . Hypercholesteremia   . Hypertension   . Tobacco user      Past Surgical History:  Procedure Laterality Date  . ADENOIDECTOMY    . PROSTATECTOMY    . TONSILLECTOMY      Social History   Socioeconomic History  . Marital status: Married    Spouse name: Not on file  . Number of children: Not on file  . Years of education: Not on file  . Highest education level: Not on file  Occupational History  . Not on file  Social Needs  . Financial resource strain: Not on file  . Food insecurity    Worry: Not on file    Inability: Not on file  . Transportation needs    Medical: Not on file    Non-medical: Not on file  Tobacco Use  . Smoking status: Current Every Day Smoker    Packs/day: 0.50    Years: 55.00    Pack years: 27.50  . Smokeless tobacco: Never Used  . Tobacco comment: Pt. is contemplating quitting  Substance and Sexual Activity  . Alcohol use: Yes  . Drug use: No  . Sexual activity: Not on file  Lifestyle  . Physical activity    Days per week: Not on file    Minutes per session: Not on file  . Stress: Not on file  Relationships  . Social Herbalist on phone: Not on file    Gets together: Not on file    Attends religious service: Not on file    Active member of club or organization: Not on file    Attends meetings of clubs or  organizations: Not on file    Relationship status: Not on file  . Intimate partner violence    Fear of current or ex partner: Not on file    Emotionally abused: Not on file    Physically abused: Not on file    Forced sexual activity: Not on file  Other Topics Concern  . Not on file  Social History Narrative  . Not on file    Review of Systems  Constitution: Negative for decreased appetite, malaise/fatigue, weight gain and weight loss.  Eyes: Negative for visual disturbance.  Cardiovascular: Negative for chest pain, claudication, dyspnea on exertion, leg swelling, orthopnea, palpitations and syncope.  Respiratory: Positive for cough. Negative for hemoptysis and wheezing.   Endocrine:  Negative for cold intolerance and heat intolerance.  Hematologic/Lymphatic: Does not bruise/bleed easily.  Skin: Negative for nail changes.  Musculoskeletal: Negative for muscle weakness and myalgias.  Gastrointestinal: Negative for abdominal pain, change in bowel habit, nausea and vomiting.  Neurological: Negative for difficulty with concentration, dizziness, focal weakness and headaches.  Psychiatric/Behavioral: Negative for altered mental status and suicidal ideas.  All other systems reviewed and are negative.     Objective:  There were no vitals taken for this visit. There is no height or weight on file to calculate BMI.    Physical Exam  Constitutional: He is oriented to person, place, and time. Vital signs are normal. He appears well-developed and well-nourished.  HENT:  Head: Normocephalic and atraumatic.  Neck: Normal range of motion.  Cardiovascular: Normal rate, regular rhythm, normal heart sounds and intact distal pulses.  Pulses:      Femoral pulses are 2+ on the right side and 2+ on the left side.      Popliteal pulses are 2+ on the right side and 2+ on the left side.       Dorsalis pedis pulses are 2+ on the right side and 0 on the left side.       Posterior tibial pulses are 2+ on the right side and 2+ on the left side.  Pulmonary/Chest: Effort normal and breath sounds normal. No accessory muscle usage. No respiratory distress.  Abdominal: Soft. Bowel sounds are normal.  Musculoskeletal: Normal range of motion.  Neurological: He is alert and oriented to person, place, and time.  Skin: Skin is warm and dry.  Vitals reviewed.  Radiology:  CT scan of the chest 06/23/2017: Aortic atherosclerosis with three-vessel coronary occasion in LAD, left circumflex, and right coronary artery. 3.7 x 3.5 cm fatty attenuation mass in left adrenal gland compatible with adrenal myolipoma. Mild diffuse bronchial wall thickening with mild central lobular and paraseptal emphysema imaging  findings suggestive of underlying COPD. No suspicious appearing pulmonary nodules or masses.  Laboratory examination:   *** CMP Latest Ref Rng & Units 05/13/2018 05/20/2017 03/20/2008  Glucose 70 - 99 mg/dL 97 92 112(H)  BUN 8 - 23 mg/dL 20 19 15   Creatinine 0.61 - 1.24 mg/dL 1.55(H) 1.33(H) 1.06  Sodium 135 - 145 mmol/L 136 134(L) 138  Potassium 3.5 - 5.1 mmol/L 3.7 4.0 4.1  Chloride 98 - 111 mmol/L 104 99(L) 103  CO2 22 - 32 mmol/L 23 23 29   Calcium 8.9 - 10.3 mg/dL 8.4(L) 9.0 9.4  Total Protein 6.5 - 8.1 g/dL 6.1(L) - -  Total Bilirubin 0.3 - 1.2 mg/dL 0.9 - -  Alkaline Phos 38 - 126 U/L 51 - -  AST 15 - 41 U/L 30 - -  ALT 0 - 44 U/L 32 - -  CBC Latest Ref Rng & Units 05/13/2018 05/20/2017 03/25/2008  WBC 4.0 - 10.5 K/uL 6.4 5.7 -  Hemoglobin 13.0 - 17.0 g/dL 13.6 15.3 12.4(L)  Hematocrit 39.0 - 52.0 % 41.6 43.5 36.6(L)  Platelets 150 - 400 K/uL 158 105(L) -   Lipid Panel  No results found for: CHOL, TRIG, HDL, CHOLHDL, VLDL, LDLCALC, LDLDIRECT HEMOGLOBIN A1C No results found for: HGBA1C, MPG TSH No results for input(s): TSH in the last 8760 hours.  PRN Meds:. There are no discontinued medications. No outpatient medications have been marked as taking for the 01/15/19 encounter (Appointment) with Miquel Dunn, NP.    Cardiac Studies:    Exercise Treadmill Stress Test 10/02/2017: Indication: Coronary Artery Calcification  The patient exercised on Bruce protocol for 6:24 min. Patient achieved 7.55 METS and reached HR 136 bpm, which is 92% of maximum age-predicted HR. Stress test terminated due to fatigue.  Exercise capacity was low normal. HR Response to Exercise: Appropriate. BP Response to Exercise: Normal resting BP- appropriate response. Chest Pain: none. Arrhythmias: Rare PAC's ST Changes: With peak exercise there was no ST-T changes of ischemia.  Overall Impression: Normal stress test. Continue primary/secondary prevention.  Abdominal aortic duplex  09/20/2017: An abdominal aortic aneurysm measuring 2.9 x 2.93 x 2.99 cm is seen in the proximal aorta. Diffuse calcific plaque observed in the proximal, mid and distal aorta. Bilateral triphasic normal flow velocities noted in iliac arteries. Recheck in 2 years.  Echocardiogram 09/20/2017: Left ventricle cavity is normal in size. Moderate concentric hypertrophy of the left ventricle. Normal global wall motion. Normal diastolic filling pattern. Calculated EF 55%. Moderate (Grade II) mitral regurgitation. Mild tricuspid regurgitation. No evidence of pulmonary hypertension.  Assessment:   No diagnosis found.  ***  Recommendations:   Randy Morton is a Caucasian male with history of hypertension, hyperlipidemia, GERD, Barrett's esophagus referred to me for evaluation of coronary artery disease. Recently he underwent CT scan of the chest for lung cancer screening, which revealed aortic atherosclerosis as well as atherosclerosis of the great vessels of the mediastinum and also coronary arteries fortunately did not reveal any malignant lesions but mild centrilobular emphysema and paraseptal emphysema and a left adrenal myelolipoma.  Miquel Dunn, MSN, APRN, FNP-C [facility]. [place] Office: [phone] Fax: [phone]

## 2019-01-21 ENCOUNTER — Other Ambulatory Visit: Payer: Self-pay | Admitting: *Deleted

## 2019-01-21 DIAGNOSIS — Z87891 Personal history of nicotine dependence: Secondary | ICD-10-CM

## 2019-01-21 DIAGNOSIS — F17210 Nicotine dependence, cigarettes, uncomplicated: Secondary | ICD-10-CM

## 2019-01-21 DIAGNOSIS — Z122 Encounter for screening for malignant neoplasm of respiratory organs: Secondary | ICD-10-CM

## 2019-01-31 ENCOUNTER — Telehealth: Payer: Self-pay | Admitting: Cardiology

## 2019-04-15 ENCOUNTER — Ambulatory Visit: Payer: Medicare Other | Attending: Internal Medicine

## 2019-04-15 DIAGNOSIS — Z23 Encounter for immunization: Secondary | ICD-10-CM | POA: Insufficient documentation

## 2019-04-15 NOTE — Progress Notes (Signed)
   Covid-19 Vaccination Clinic  Name:  Randy Morton    MRN: [id number] DOB: April 20, 1945  04/15/2019  [name] was observed post Covid-19 immunization for 15 minutes   without incidence. He was provided with Vaccine Information Sheet and instruction to access the V-Safe system.   [name] was instructed to call 911 with any severe reactions post vaccine: Marland Kitchen Difficulty breathing  . Swelling of your face and throat  . A fast heartbeat  . A bad rash all over your body  . Dizziness and weakness    Immunizations Administered    Name Date Dose VIS Date Route   Pfizer COVID-19 Vaccine 04/15/2019 12:36 PM 0.3 mL 03/15/2019 Intramuscular   Manufacturer: [place]   Lot: [id number]   [place]: [id number]

## 2019-05-03 ENCOUNTER — Ambulatory Visit: Payer: Medicare Other

## 2019-05-04 ENCOUNTER — Ambulatory Visit: Payer: Medicare Other

## 2019-05-16 ENCOUNTER — Ambulatory Visit: Payer: Medicare Other

## 2019-05-16 ENCOUNTER — Other Ambulatory Visit: Payer: Self-pay

## 2019-05-16 ENCOUNTER — Ambulatory Visit
Admission: RE | Admit: 2019-05-16 | Discharge: 2019-05-16 | Disposition: A | Discharge status: Home / Self Care | Payer: Medicare Other | Source: Ambulatory Visit | Attending: Acute Care | Admitting: Acute Care

## 2019-05-16 ENCOUNTER — Ambulatory Visit: Payer: Medicare Other | Attending: Internal Medicine

## 2019-05-16 DIAGNOSIS — F17210 Nicotine dependence, cigarettes, uncomplicated: Secondary | ICD-10-CM | POA: Diagnosis not present

## 2019-05-16 DIAGNOSIS — Z87891 Personal history of nicotine dependence: Secondary | ICD-10-CM

## 2019-05-16 DIAGNOSIS — Z122 Encounter for screening for malignant neoplasm of respiratory organs: Secondary | ICD-10-CM

## 2019-05-16 DIAGNOSIS — Z23 Encounter for immunization: Secondary | ICD-10-CM | POA: Insufficient documentation

## 2019-05-16 NOTE — Progress Notes (Signed)
   Covid-19 Vaccination Clinic  Name:  Randy Morton    MRN: [id number] DOB: 09/21/1945  05/16/2019  [name] was observed post Covid-19 immunization for 15 minutes without incidence. He was provided with Vaccine Information Sheet and instruction to access the V-Safe system.   [name] was instructed to call 911 with any severe reactions post vaccine: Marland Kitchen Difficulty breathing  . Swelling of your face and throat  . A fast heartbeat  . A bad rash all over your body  . Dizziness and weakness    Immunizations Administered    Name Date Dose VIS Date Route   Pfizer COVID-19 Vaccine 05/16/2019 10:42 AM 0.3 mL 03/15/2019 Intramuscular   Manufacturer: [place]   Lot: [id number]   [place]: [id number]

## 2019-05-22 NOTE — Progress Notes (Signed)
Please call patient and let them  know their  low dose Ct was read as a Lung RADS 2: nodules that are benign in appearance and behavior with a very low likelihood of becoming a clinically active cancer due to size or lack of growth. Recommendation per radiology is for a repeat LDCT in 12 months. Please let them  know we will order and schedule their  annual screening scan for 05/2020. Please let them  know there was notation of CAD on their  scan.  Please remind the patient  that this is a non-gated exam therefore degree or severity of disease  cannot be determined. Please have them  follow up with their PCP regarding potential risk factor modification, dietary therapy or pharmacologic therapy if clinically indicated. Pt.  is  currently on statin therapy. Please place order for annual  screening scan for  05/2020 and fax results to PCP. Thanks so much. 

## 2019-05-27 ENCOUNTER — Ambulatory Visit: Payer: Medicare Other

## 2019-05-27 ENCOUNTER — Other Ambulatory Visit: Payer: Self-pay | Admitting: *Deleted

## 2019-05-27 DIAGNOSIS — F17210 Nicotine dependence, cigarettes, uncomplicated: Secondary | ICD-10-CM

## 2019-05-27 DIAGNOSIS — Z87891 Personal history of nicotine dependence: Secondary | ICD-10-CM

## 2019-06-18 DIAGNOSIS — L821 Other seborrheic keratosis: Secondary | ICD-10-CM | POA: Diagnosis not present

## 2019-06-18 DIAGNOSIS — D692 Other nonthrombocytopenic purpura: Secondary | ICD-10-CM | POA: Diagnosis not present

## 2019-06-18 DIAGNOSIS — Z85828 Personal history of other malignant neoplasm of skin: Secondary | ICD-10-CM | POA: Diagnosis not present

## 2019-06-18 DIAGNOSIS — Z85820 Personal history of malignant melanoma of skin: Secondary | ICD-10-CM | POA: Diagnosis not present

## 2019-07-22 DIAGNOSIS — I10 Essential (primary) hypertension: Secondary | ICD-10-CM | POA: Diagnosis not present

## 2019-07-22 DIAGNOSIS — E7800 Pure hypercholesterolemia, unspecified: Secondary | ICD-10-CM | POA: Diagnosis not present

## 2019-07-22 DIAGNOSIS — Z125 Encounter for screening for malignant neoplasm of prostate: Secondary | ICD-10-CM | POA: Diagnosis not present

## 2019-07-25 DIAGNOSIS — J309 Allergic rhinitis, unspecified: Secondary | ICD-10-CM | POA: Diagnosis not present

## 2019-07-25 DIAGNOSIS — I10 Essential (primary) hypertension: Secondary | ICD-10-CM | POA: Diagnosis not present

## 2019-07-25 DIAGNOSIS — Z0001 Encounter for general adult medical examination with abnormal findings: Secondary | ICD-10-CM | POA: Diagnosis not present

## 2019-07-25 DIAGNOSIS — K219 Gastro-esophageal reflux disease without esophagitis: Secondary | ICD-10-CM | POA: Diagnosis not present

## 2019-08-26 NOTE — Progress Notes (Signed)
Telephone visit note  Subjective:   Randy Morton, male    DOB: 11-08-1945, 74 y.o.   MRN: [id number]   I connected with the patient on 08/27/19 by a telephone call and verified that I am speaking with the correct person using two identifiers.     I offered the patient a video enabled application for a virtual visit. Unfortunately, this could not be accomplished due to technical difficulties/lack of video enabled phone/computer. I discussed the limitations of evaluation and management by telemedicine and the availability of in person appointments. The patient expressed understanding and agreed to proceed.   This visit type was conducted due to national recommendations for restrictions regarding the COVID-19 Pandemic (e.g. social distancing).  This format is felt to be most appropriate for this patient at this time.  All issues noted in this document were discussed and addressed.  No physical exam was performed (except for noted visual exam findings with Tele health visits).  The patient has consented to conduct a Tele health visit and understands insurance will be billed.   Chief Complaint  Patient presents with  . Coronary Artery Disease  . AAA  . Follow-up    1 year   HPI  74 y.o. Caucasian male with history of hypertension, hyperlipidemia, GERD, Barrett's esophagus, abdominal aortic aneurysm, coronary artery atherosclerosis noted on nongated CT, initially referred to the office for evaluation of coronary artery disease.   Today's encounter was via telephone as noted above.  He was last seen in 11/2017 by Jeri Lager, APRN, FNP-C during which time he was doing well and was recommended to have an ultrasound of the abdomen to evaluate abdominal aortic aneurysm.  Patient recently had his physical with his primary care provider and was recommended to follow-up with cardiology to have this test performed given his prior findings as noted below.  Clinically patient is doing well and does  not complain of any abdominal pain or pain in the back or bilateral groin areas.  He can he needs to smoke approximately 7 to 8 cigarettes/day with the intention of stopping in the near future.  His most recent lipid profile from April 2021 reviewed and overall favorable as the lipid profile is improving.   When he was diagnosed with coronary artery atherosclerosis and nongated CT he underwent an extensive cardiac evaluation which included an echocardiogram and stress test findings noted below for further reference.  Patient continues to work at the age of 17, enjoys fishing, and plays golf without any effort related symptoms of chest pain or shortness of breath.   Past Medical History:  Diagnosis Date  . Abdominal aortic aneurysm (AAA) (HCC)    Known 1.5cm AAA  . Barrett esophagus   . Cancer ([place])   . COPD (chronic obstructive pulmonary disease) ([place])   . Hypercholesteremia   . Hypertension   . Tobacco user     Past Surgical History:  Procedure Laterality Date  . ADENOIDECTOMY    . PROSTATECTOMY    . TONSILLECTOMY      Social History   Socioeconomic History  . Marital status: Married    Spouse name: Not on file  . Number of children: 2  . Years of education: Not on file  . Highest education level: Not on file  Occupational History  . Not on file  Tobacco Use  . Smoking status: Current Every Day Smoker    Packs/day: 0.50    Years: 55.00    Pack years: 27.50  .  Smokeless tobacco: Never Used  . Tobacco comment: Pt. is contemplating quitting  Substance and Sexual Activity  . Alcohol use: Yes  . Drug use: No  . Sexual activity: Not on file  Other Topics Concern  . Not on file  Social History Narrative  . Not on file   Social Determinants of Health   Financial Resource Strain:   . Difficulty of Paying Living Expenses:   Food Insecurity:   . Worried About Charity fundraiser in the Last Year:   . Arboriculturist in the Last Year:   Transportation Needs:   . Lexicographer (Medical):   Marland Kitchen Lack of Transportation (Non-Medical):   Physical Activity:   . Days of Exercise per Week:   . Minutes of Exercise per Session:   Stress:   . Feeling of Stress :   Social Connections:   . Frequency of Communication with Friends and Family:   . Frequency of Social Gatherings with Friends and Family:   . Attends Religious Services:   . Active Member of Clubs or Organizations:   . Attends Archivist Meetings:   Marland Kitchen Marital Status:   Intimate Partner Violence:   . Fear of Current or Ex-Partner:   . Emotionally Abused:   Marland Kitchen Physically Abused:   . Sexually Abused:    Family History  Problem Relation Age of Onset  . CVA Mother   . Lung cancer Father    Current Outpatient Medications on File Prior to Visit  Medication Sig Dispense Refill  . aspirin 81 MG chewable tablet Chew 162 mg by mouth daily.    Marland Kitchen atorvastatin (LIPITOR) 40 MG tablet Take 40 mg by mouth daily.   2  . pantoprazole (PROTONIX) 40 MG tablet Take 40 mg by mouth daily. Barrett's Esophagus  2  . telmisartan (MICARDIS) 80 MG tablet Take 40 mg by mouth daily.     No current facility-administered medications on file prior to visit.    Cardiovascular and other pertinent studies: CT scan of the chest: 06/23/2017:  Aortic atherosclerosis and calcified atherosclerotic plaque in LAD, RCA, and LCx .  Echocardiogram: 09/20/2017:  Left ventricle cavity is normal in size. Moderate concentric hypertrophy of the left ventricle. Normal global wall motion. Normal diastolic filling pattern. Calculated EF 55%. Moderate (Grade II) mitral regurgitation. Mild tricuspid regurgitation.  No evidence of pulmonary hypertension.  Abdominal aortic duplex: 09/20/2017: An abdominal aortic aneurysm measuring 2.9 x 2.93 x 2.99 cm is seen in the proximal aorta. Diffuse calcific plaque observed in the proximal, mid and distal aorta. Bilateral triphasic normal flow velocities noted in iliac arteries. Recheck in 2  years.  Exercise Treadmill Stress Test: 10/02/2017: Indication: Coronary Artery Calcification The patient exercised on Bruce protocol for 6:24 min. Patient achieved 7.55 METS and reached HR 136 bpm, which is 92% of maximum age-predicted HR. Stress test terminated due to fatigue.  Exercise capacity was low normal. HR Response to Exercise: Appropriate. BP Response to Exercise: Normal resting BP- appropriate response. Chest Pain: none. Arrhythmias: Rare PAC's ST Changes: With peak exercise there was no ST-T changes of ischemia.  Overall Impression: Normal stress test. Continue primary/secondary prevention.  EKG  05/13/2018: Sinus rhythm, Incomplete right bundle branch block, Borderline ST elevation, anterior leads  Recent labs: 04/26/2017: Total cholesterol 133, triglycerides 114, HDL 38, LDL 72. BUN 14, creatinine 1.11, eGFR greater than 60 mL, CMP otherwise normal. 07/22/2019: Chol 123, TG 87, HDL 42.  05/13/2018: Glucose 97, BUN/Cr 20/1.55. Na/K  136/3.7. Rest of the CMP normal. H/H 13.6/41.6. MCV 94.3. Platelets 158  Review of Systems  Constitution: Negative for chills and fever.  HENT: Negative for hoarse voice and nosebleeds.   Eyes: Negative for discharge, double vision and pain.  Cardiovascular: Negative for chest pain, claudication, dyspnea on exertion, leg swelling, near-syncope, orthopnea, palpitations, paroxysmal nocturnal dyspnea and syncope.  Respiratory: Negative for hemoptysis and shortness of breath.   Musculoskeletal: Negative for muscle cramps and myalgias.  Gastrointestinal: Negative for abdominal pain, constipation, diarrhea, hematemesis, hematochezia, melena, nausea and vomiting.  Neurological: Negative for dizziness and light-headedness.       There were no vitals filed for this visit. (Patient did not have an means of checking prior to today's encounter.)  Physical Exam: Not performed, as this is a telephone visit     JAGJIT RINER is a 74 y.o. male  whose past medical history and cardiac risk factors include: hypertension, hyperlipidemia, GERD, Barrett's esophagus, abdominal aortic aneurysm, coronary artery atherosclerosis noted on nongated CT.    ICD-10-CM   1. Abdominal aortic aneurysm (AAA) without rupture (HCC)  I71.4 PCV AORTA DUPLEX  2. Cigarette smoker  F17.210   3. Benign hypertension  I10   4. Mixed hyperlipidemia  E78.2      Recommendations: Abdominal aortic aneurysm, without rupture and asymptomatic: Repeat 2-year follow-up abdominal duplex. Educated on the importance of smoking cessation completely. Most recent lipid profile reviewed. Continue statin therapy.  Benign essential hypertension: Currently managed by primary team.  Mixed hyperlipidemia: Currently managed by primary team.  Continue statin therapy.  Does not endorse any evidence of myalgias.   Orders Placed This Encounter  Procedures  . PCV AORTA DUPLEX   --Continue cardiac medications as reconciled in final medication list. --Return in about 4 weeks (around 09/24/2019) for review test results.. Or sooner if needed. --Continue follow-up with your primary care physician regarding the management of your other chronic comorbid conditions.  Patient's questions and concerns were addressed to his satisfaction. He voices understanding of the instructions provided during this encounter.   This note was created using a voice recognition software as a result there may be grammatical errors inadvertently enclosed that do not reflect the nature of this encounter. Every attempt is made to correct such errors.  Rex Kras, DO, [facility]. [place] Office: [phone]

## 2019-08-27 ENCOUNTER — Encounter: Payer: Self-pay | Admitting: Cardiology

## 2019-08-27 ENCOUNTER — Telehealth: Payer: Medicare Other | Admitting: Cardiology

## 2019-08-27 DIAGNOSIS — F17210 Nicotine dependence, cigarettes, uncomplicated: Secondary | ICD-10-CM

## 2019-08-27 DIAGNOSIS — I714 Abdominal aortic aneurysm, without rupture, unspecified: Secondary | ICD-10-CM

## 2019-08-27 DIAGNOSIS — I10 Essential (primary) hypertension: Secondary | ICD-10-CM | POA: Diagnosis not present

## 2019-08-27 DIAGNOSIS — E782 Mixed hyperlipidemia: Secondary | ICD-10-CM

## 2019-09-04 ENCOUNTER — Other Ambulatory Visit: Payer: Medicare Other

## 2019-09-05 ENCOUNTER — Other Ambulatory Visit: Payer: Medicare Other

## 2019-09-13 ENCOUNTER — Other Ambulatory Visit: Payer: Self-pay

## 2019-09-13 ENCOUNTER — Ambulatory Visit: Payer: Medicare Other

## 2019-09-13 DIAGNOSIS — I714 Abdominal aortic aneurysm, without rupture, unspecified: Secondary | ICD-10-CM

## 2019-09-13 DIAGNOSIS — I700 Atherosclerosis of aorta: Secondary | ICD-10-CM

## 2019-09-24 ENCOUNTER — Ambulatory Visit: Payer: Medicare Other | Admitting: Cardiology

## 2019-09-24 ENCOUNTER — Other Ambulatory Visit: Payer: Self-pay

## 2019-09-24 ENCOUNTER — Encounter: Payer: Self-pay | Admitting: Cardiology

## 2019-09-24 VITALS — BP 132/80 | HR 66 | Resp 16 | Ht 69.5 in | Wt 206.0 lb

## 2019-09-24 DIAGNOSIS — Z712 Person consulting for explanation of examination or test findings: Secondary | ICD-10-CM | POA: Diagnosis not present

## 2019-09-24 DIAGNOSIS — I10 Essential (primary) hypertension: Secondary | ICD-10-CM | POA: Diagnosis not present

## 2019-09-24 DIAGNOSIS — I714 Abdominal aortic aneurysm, without rupture, unspecified: Secondary | ICD-10-CM

## 2019-09-24 DIAGNOSIS — E782 Mixed hyperlipidemia: Secondary | ICD-10-CM

## 2019-09-24 DIAGNOSIS — F17210 Nicotine dependence, cigarettes, uncomplicated: Secondary | ICD-10-CM | POA: Diagnosis not present

## 2019-09-24 NOTE — Progress Notes (Signed)
Randy Morton Date of Birth: 03/24/1946 MRN: [id number] Primary Care Provider:Pharr, Thayer Jew, MD Former Cardiology Providers: Jeri Lager, APRN, FNP-C Primary Cardiologist: Rex Kras, DO, [facility] (established care 08/27/2019)  Date: 09/24/19 Last Visit: 08/27/2019 (telehealth)  Chief Complaint  Patient presents with  . AAA  . Follow-up    4 week    HPI  Randy Morton is a 74 y.o.  male who presents to the office with a chief complaint of "review test results." Patient's past medical history and cardiovascular risk factors include: hypertension, hyperlipidemia, GERD, Barrett's esophagus, abdominal aortic aneurysm and atherosclerosis, coronary artery atherosclerosis noted on nongated CT.   Patient is accompanied by his wife at today's office visit.  Patient was being followed by Jeri Lager, APRN, FNP-C for his asymptomatic abdominal aortic aneurysm.  He had a recent abdominal aortic duplex which were reviewed with him and his wife at today's office visit.  Patient was informed that his abdominal aorta dimensions remain stable compared to prior study.  But he does have severe atherosclerotic plaques noted within the abdominal aorta.  He also has sclerotic burden within the coronary vasculature based on prior CT scans as well.  He continues to be smoking at least half a pack a day and is on statin therapy as well as aspirin.  Most recent lipid profile noted with LDL less than 70 mg/dL.  However, I have asked him to discuss it further with his PCP on uptitrating statin therapy given the plaque burden in multiple vascular beds.  He is also educated on dietary restrictions of high cholesterol foods.  Patient's wife is concerned that he may also have disease within his carotid vasculature and is recommending that we check a carotid duplex.  Patient denies any chest pain or shortness of breath at rest or with effort related activities.  FUNCTIONAL STATUS: Enjoys fishing, and plays golf.    ALLERGIES: No Known Allergies   MEDICATION LIST PRIOR TO VISIT: Current Outpatient Medications on File Prior to Visit  Medication Sig Dispense Refill  . aspirin 81 MG chewable tablet Chew 162 mg by mouth daily.    Marland Kitchen atorvastatin (LIPITOR) 40 MG tablet Take 40 mg by mouth daily.   2  . pantoprazole (PROTONIX) 40 MG tablet Take 40 mg by mouth daily. Barrett's Esophagus  2  . telmisartan (MICARDIS) 80 MG tablet Take 40 mg by mouth daily.     No current facility-administered medications on file prior to visit.    PAST MEDICAL HISTORY: Past Medical History:  Diagnosis Date  . Abdominal aortic aneurysm (AAA) (HCC)    Known 1.5cm AAA  . Barrett esophagus   . Cancer ([place])   . COPD (chronic obstructive pulmonary disease) ([place])   . Hypercholesteremia   . Hypertension   . Tobacco user     PAST SURGICAL HISTORY: Past Surgical History:  Procedure Laterality Date  . ADENOIDECTOMY    . PROSTATECTOMY    . TONSILLECTOMY      FAMILY HISTORY: The patient's family history includes CVA in his mother; Lung cancer in his father.   SOCIAL HISTORY:  The patient  reports that he has been smoking. He has a 27.50 pack-year smoking history. He has never used smokeless tobacco. He reports current alcohol use. He reports that he does not use drugs.  Review of Systems  Constitutional: Negative for chills and fever.  HENT: Negative for hoarse voice and nosebleeds.   Eyes: Negative for discharge, double vision and pain.  Cardiovascular: Negative for  chest pain, claudication, dyspnea on exertion, leg swelling, near-syncope, orthopnea, palpitations, paroxysmal nocturnal dyspnea and syncope.  Respiratory: Negative for hemoptysis and shortness of breath.   Musculoskeletal: Negative for muscle cramps and myalgias.  Gastrointestinal: Negative for abdominal pain, constipation, diarrhea, hematemesis, hematochezia, melena, nausea and vomiting.  Neurological: Negative for dizziness and light-headedness.     PHYSICAL EXAM: Vitals with BMI 09/24/2019 05/13/2018 05/13/2018  Height 5' 9.5" - -  Weight 206 lbs - -  BMI 73.53 - -  Systolic 299 242 683  Diastolic 80 88 83  Pulse 66 83 85    CONSTITUTIONAL: Well-developed and well-nourished. No acute distress.  SKIN: Skin is warm and dry. No rash noted. No cyanosis. No pallor. No jaundice HEAD: Normocephalic and atraumatic.  EYES: No scleral icterus MOUTH/THROAT: Moist oral membranes.  NECK: No JVD present. No thyromegaly noted. No carotid bruits  LYMPHATIC: No visible cervical adenopathy.  CHEST Normal respiratory effort. No intercostal retractions  LUNGS: Clear to auscultation bilaterally. No stridor. No wheezes. No rales.  CARDIOVASCULAR: Regular rate and rhythm, positive M1-D6, soft holosystolic murmur heard at the apex, no rubs or gallops appreciated. ABDOMINAL: Soft, nontender, nondistended, positive bowel sounds in all 4 quadrants, no apparent ascites.  EXTREMITIES: No peripheral edema, warm to touch bilaterally, 2+ dorsalis pedis and posterior tibial pulses. HEMATOLOGIC: No significant bruising NEUROLOGIC: Oriented to person, place, and time. Nonfocal. Normal muscle tone.  PSYCHIATRIC: Normal mood and affect. Normal behavior. Cooperative  RADIOLOGY: CT scan of the chest: 06/23/2017:  Aortic atherosclerosis and calcified atherosclerotic plaque in LAD, RCA, and LCx .  CARDIAC DATABASE: EKG: 09/24/2019: Sinus  Rhythm, 66 bpm, incomplete right bundle branch block, without underlying ischemia or injury pattern.   Echocardiogram: 09/20/2017:  Left ventricle cavity is normal in size. Moderate concentric hypertrophy of the left ventricle. Normal global wall motion. Normal diastolic filling pattern. Calculated EF 55%. Moderate (Grade II) mitral regurgitation. Mild tricuspid regurgitation.  No evidence of pulmonary hypertension.  Stress Testing:  Exercise Treadmill Stress Test: 10/02/2017: Indication: Coronary Artery Calcification The  patient exercised on Bruce protocol for 6:24 min. Patient achieved 7.55 METS and reached HR 136 bpm, which is 92% of maximum age-predicted HR. Stress test terminated due to fatigue.  Exercise capacity was low normal. HR Response to Exercise: Appropriate. BP Response to Exercise: Normal resting BP- appropriate response. Chest Pain: none. Arrhythmias: Rare PAC's ST Changes: With peak exercise there was no ST-T changes of ischemia.  Overall Impression: Normal stress test. Continue primary/secondary prevention.  Vascular imaging: Abdominal Aortic Duplex 09/13/2019:  Severe plaque noted in the distal aorta. Diffuse plaque noted in the proximal and mid aorta. Clinical correlation is suggested.  An abdominal aortic aneurysm measuring 2.92 x 2.9 x 2.93 cm is seen.  Normal iliac artery velocity.  No significant change from 09/20/2017. Recheck in 10 years if clinically indicated.   LABORATORY DATA: CBC Latest Ref Rng & Units 05/13/2018 05/20/2017 03/25/2008  WBC 4.0 - 10.5 K/uL 6.4 5.7 -  Hemoglobin 13.0 - 17.0 g/dL 13.6 15.3 12.4(L)  Hematocrit 39 - 52 % 41.6 43.5 36.6(L)  Platelets 150 - 400 K/uL 158 105(L) -    CMP Latest Ref Rng & Units 05/13/2018 05/20/2017 03/20/2008  Glucose 70 - 99 mg/dL 97 92 112(H)  BUN 8 - 23 mg/dL _0 Creatinine 0.61 - 1.24 mg/dL 1.55(H) 1.33(H) 1.06  Sodium 135 - 145 mmol/L 136 134(L) 138  Potassium 3.5 - 5.1 mmol/L 3.7 4.0 4.1  Chloride 98 - 111 mmol/L 104  99(L) 103  CO2 22 - 32 mmol/L _0 Calcium 8.9 - 10.3 mg/dL 8.4(L) 9.0 9.4  Total Protein 6.5 - 8.1 g/dL 6.1(L) - -  Total Bilirubin 0.3 - 1.2 mg/dL 0.9 - -  Alkaline Phos 38 - 126 U/L 51 - -  AST 15 - 41 U/L 30 - -  ALT 0 - 44 U/L 32 - -   04/26/2017: Total cholesterol 133, triglycerides 114, HDL 38, LDL 72. BUN 14, creatinine 1.11, eGFR greater than 60 mL, CMP otherwise normal.  07/22/2019: Chol 123, TG 87, HDL 42, LDL-C 63  IMPRESSION:    ICD-10-CM   1. Encounter to discuss test  results  Z71.2   2. Abdominal aortic aneurysm (AAA) without rupture (HCC)  I71.4 EKG 12-Lead  3. Cigarette smoker  F17.210 PCV CAROTID DUPLEX (BILATERAL)  4. Benign hypertension  I10   5. Mixed hyperlipidemia  E78.2 PCV CAROTID DUPLEX (BILATERAL)     RECOMMENDATIONS: BARDIA WANGERIN is a 74 y.o. male whose past medical history and cardiovascular risk factors include: hypertension, hyperlipidemia, GERD, Barrett's esophagus, abdominal aortic aneurysm, coronary artery atherosclerosis noted on nongated CT.  Abdominal aortic aneurysm, without rupture and asymptomatic:  Follow-up abdominal duplex reviewed with patient and his wife.   Educated on the importance of complete smoking cessation.   Recommended SBP goal between 120-130 mmHg or lower is tolerated without becoming symptomatic.   Currently on Lipitor, lipids are currently managed by primary team.   Most recent LDL is <58m/dL.   May consider uptitrated medical tx given the fact patient has atherosclerosis in multiple vascular beds (I.e. aortic, coronary, abdominal aorta). Patient states he will discuss with his PCP.   Benign essential hypertension: Currently managed by primary team.  Mixed hyperlipidemia: See above. Check carotid duplex. Does not endorse any evidence of myalgias.  FINAL MEDICATION LIST END OF ENCOUNTER: No orders of the defined types were placed in this encounter.   There are no discontinued medications.   Current Outpatient Medications:  .  aspirin 81 MG chewable tablet, Chew 162 mg by mouth daily., Disp: , Rfl:  .  atorvastatin (LIPITOR) 40 MG tablet, Take 40 mg by mouth daily. , Disp: , Rfl: 2 .  pantoprazole (PROTONIX) 40 MG tablet, Take 40 mg by mouth daily. Barrett's Esophagus, Disp: , Rfl: 2 .  telmisartan (MICARDIS) 80 MG tablet, Take 40 mg by mouth daily., Disp: , Rfl:   Orders Placed This Encounter  Procedures  . EKG 12-Lead  . PCV CAROTID DUPLEX (BILATERAL)   --Continue cardiac medications  as reconciled in final medication list. --Return in about 6 months (around 03/25/2020) for re-evaluation of symptoms.. Or sooner if needed. --Continue follow-up with your primary care physician regarding the management of your other chronic comorbid conditions.  Patient's questions and concerns were addressed to his satisfaction. He voices understanding of the instructions provided during this encounter.   This note was created using a voice recognition software as a result there may be grammatical errors inadvertently enclosed that do not reflect the nature of this encounter. Every attempt is made to correct such errors.  SRex Kras DN[place] [facility] Pager: [phone]: [phone]

## 2019-10-21 ENCOUNTER — Ambulatory Visit: Payer: Medicare Other

## 2019-10-21 ENCOUNTER — Other Ambulatory Visit: Payer: Self-pay

## 2019-10-21 DIAGNOSIS — E782 Mixed hyperlipidemia: Secondary | ICD-10-CM

## 2019-10-21 DIAGNOSIS — I6523 Occlusion and stenosis of bilateral carotid arteries: Secondary | ICD-10-CM

## 2019-10-21 DIAGNOSIS — F17210 Nicotine dependence, cigarettes, uncomplicated: Secondary | ICD-10-CM

## 2019-10-23 ENCOUNTER — Other Ambulatory Visit: Payer: Self-pay | Admitting: Cardiology

## 2019-10-23 DIAGNOSIS — I6523 Occlusion and stenosis of bilateral carotid arteries: Secondary | ICD-10-CM

## 2019-11-01 ENCOUNTER — Telehealth: Payer: Self-pay

## 2020-03-18 ENCOUNTER — Ambulatory Visit: Payer: Medicare Other | Admitting: Cardiology

## 2020-03-26 DIAGNOSIS — H524 Presbyopia: Secondary | ICD-10-CM | POA: Diagnosis not present

## 2020-04-14 ENCOUNTER — Ambulatory Visit: Payer: Medicare Other | Admitting: Cardiology

## 2020-04-16 ENCOUNTER — Ambulatory Visit: Payer: Medicare Other | Admitting: Cardiology

## 2020-04-28 ENCOUNTER — Ambulatory Visit: Payer: Medicare Other | Admitting: Cardiology

## 2020-05-01 ENCOUNTER — Other Ambulatory Visit: Payer: Self-pay

## 2020-05-01 ENCOUNTER — Telehealth: Payer: Medicare Other | Admitting: Cardiology

## 2020-05-01 ENCOUNTER — Encounter: Payer: Self-pay | Admitting: Cardiology

## 2020-05-01 VITALS — Ht 69.5 in | Wt 201.0 lb

## 2020-05-01 DIAGNOSIS — I6523 Occlusion and stenosis of bilateral carotid arteries: Secondary | ICD-10-CM

## 2020-05-01 DIAGNOSIS — I10 Essential (primary) hypertension: Secondary | ICD-10-CM

## 2020-05-01 DIAGNOSIS — I714 Abdominal aortic aneurysm, without rupture, unspecified: Secondary | ICD-10-CM

## 2020-05-01 DIAGNOSIS — F17210 Nicotine dependence, cigarettes, uncomplicated: Secondary | ICD-10-CM | POA: Diagnosis not present

## 2020-05-01 DIAGNOSIS — E782 Mixed hyperlipidemia: Secondary | ICD-10-CM

## 2020-05-01 NOTE — Progress Notes (Signed)
Randy Morton Date of Birth: 01-01-1946 MRN: [id number] Primary Care Provider:Pharr, Thayer Jew, MD Former Cardiology Providers: Jeri Lager, APRN, FNP-C Primary Cardiologist: Rex Kras, DO, [facility] (established care 08/27/2019)  Date: 05/01/20 Last Office Visit: 09/24/2019  Cardiology Video Visit via Telehealth Patient encounter was conducted via Video Visit with real-time audio and video communication.  The patient has been advised of the potential risks and limitations of this mode of treatment.  The patient has also been advised to contact this office for worsening conditions or problems, and seek emergency medical treatment and/or call 911 if the patient deems either necessary.  Patient Location: Home Provider Location: Office/Clinic  Chief Complaint  Patient presents with  . Follow-up    6 month carotid artery stenosis    HPI  Randy Morton is a 75 y.o.  male evaluated today via a video visit with a chief complaint of "6 month followup carotid artery stenosis." Patient's past medical history and cardiovascular risk factors include: hypertension, hyperlipidemia, GERD, Barrett's esophagus, abdominal aortic aneurysm and atherosclerosis, coronary artery atherosclerosis noted on nongated CT.  Patient was recently being followed at the office for his underlying asymptomatic abdominal aortic aneurysm.  He had a repeat duplex study which noted abdominal aortic dimensions within normal limits and has severe plaque noted in distal aorta.   Given his risk factors he did also undergo carotid duplex in 10/2019 and was noted to have left ICA stenosis <50%. The shared decision was to increase Lipitor to 40mg  po qhs. He has tolerated the dose well. Unforutanely, he continue to smoke 0.5ppd. His functional status remains stable.   No chest pain or heart failure symptoms. No hospitalization or urgent care visit since last OV.   ALLERGIES: No Known Allergies   MEDICATION LIST PRIOR TO  VISIT: Current Outpatient Medications on File Prior to Visit  Medication Sig Dispense Refill  . aspirin 81 MG chewable tablet Chew 162 mg by mouth daily.    Marland Kitchen atorvastatin (LIPITOR) 40 MG tablet Take 80 mg by mouth daily.  2  . pantoprazole (PROTONIX) 40 MG tablet Take 40 mg by mouth daily. Barrett's Esophagus  2  . telmisartan (MICARDIS) 80 MG tablet Take 40 mg by mouth daily.     No current facility-administered medications on file prior to visit.   Pre-visit medications were reviewed and reconciled by rooming nurse, and independently reviewed and reconciled by me at the onset of this encounter. Any new changes in medication names or instructions are found in Final Medication List  PAST MEDICAL HISTORY: Past Medical History:  Diagnosis Date  . Abdominal aortic aneurysm (AAA) (HCC)    Known 1.5cm AAA  . Barrett esophagus   . Cancer ([place])   . Carotid artery stenosis, symptomatic, left   . COPD (chronic obstructive pulmonary disease) ([place])   . Hypercholesteremia   . Hypertension   . Tobacco user     PAST SURGICAL HISTORY: Past Surgical History:  Procedure Laterality Date  . ADENOIDECTOMY    . PROSTATECTOMY    . TONSILLECTOMY      FAMILY HISTORY: The patient's family history includes CVA in his mother; Lung cancer in his father.   SOCIAL HISTORY:  The patient  reports that he has been smoking. He has a 27.50 pack-year smoking history. He has never used smokeless tobacco. He reports current alcohol use. He reports that he does not use drugs.  Review of Systems  Constitutional: Negative for chills and fever.  HENT: Negative for hoarse voice and  nosebleeds.   Eyes: Negative for discharge, double vision and pain.  Cardiovascular: Negative for chest pain, claudication, dyspnea on exertion, leg swelling, near-syncope, orthopnea, palpitations, paroxysmal nocturnal dyspnea and syncope.  Respiratory: Negative for hemoptysis and shortness of breath.   Musculoskeletal: Negative for  muscle cramps and myalgias.  Gastrointestinal: Negative for abdominal pain, constipation, diarrhea, hematemesis, hematochezia, melena, nausea and vomiting.  Neurological: Negative for dizziness and light-headedness.    PHYSICAL EXAM: The following examination entries are based on visual observation of the patient via telemedicine video: CONSTITUTIONAL: No acute distress by facial expression or demeanor during the telemedicine video.  SKIN: No apparent cyanosis, pallor, jaundice of the face by telemedicine video. HEAD: Normocephalic and atraumatic by telemedicine video.  EYES: No apparent scleral icterus by telemedicine video. MOUTH/THROAT: Lips do not appear to be dry during telemedicine video. NECK: No apparent JVD or thyromegaly noted on observation of neck during telemedicine video. LUNGS: No audible stridor or wheezing noted during telemedicine audio. CARDIOVASCULAR: Not examined during telemedicine video NEUROLOGIC: No apparent focal asymmetry of face noted during telemedicine video PSYCHIATRIC: Normal mood and affect, normal behavior, cooperative during telemedicine video  RADIOLOGY: CT scan of the chest: 06/23/2017:  Aortic atherosclerosisandcalcified atherosclerotic plaquein LAD, RCA, and LCx.  CARDIAC DATABASE: EKG: 09/24/2019: Sinus  Rhythm, 66 bpm, incomplete right bundle branch block, without underlying ischemia or injury pattern.   Echocardiogram: 09/20/2017: Left ventricle cavity is normal in size. Moderate concentric hypertrophy of the left ventricle. Normal global wall motion. Normal diastolic filling pattern. Calculated EF 55%. Moderate (Grade II) mitral regurgitation. Mild tricuspid regurgitation.  No evidence of pulmonary hypertension.  Stress Testing:  Exercise Treadmill Stress Test: 10/02/2017: Indication: Coronary Artery Calcification The patient exercised on Bruce protocol for 6:24 min. Patient achieved 7.55 METS and reached HR 136 bpm, which is 92%  of maximum age-predicted HR. Stress test terminated due to fatigue. Exercise capacity was low normal. HR Response to Exercise: Appropriate. BP Response to Exercise: Normal resting BP- appropriate response. Chest Pain: none. Arrhythmias: Rare PAC's ST Changes: With peak exercise there was no ST-T changes of ischemia.  Overall Impression: Normal stress test. Continue primary/secondary prevention.  Vascular imaging: Abdominal   Carotid artery duplex 10/21/2019:  Stenosis in the right carotid artery bifurcation (<50%).  Stenosis in the left internal carotid artery (16-49%). Stenosis in the left carotid bifurcation (<50%)  Antegrade right vertebral artery flow. Antegrade left vertebral artery flow.  Follow up in one year is appropriate if clinically indicated  LABORATORY DATA: CBC Latest Ref Rng & Units 05/13/2018 05/20/2017 03/25/2008  WBC 4.0 - 10.5 K/uL 6.4 5.7 -  Hemoglobin 13.0 - 17.0 g/dL 13.6 15.3 12.4(L)  Hematocrit 39.0 - 52.0 % 41.6 43.5 36.6(L)  Platelets 150 - 400 K/uL 158 105(L) -    CMP Latest Ref Rng & Units 05/13/2018 05/20/2017 03/20/2008  Glucose 70 - 99 mg/dL 97 92 112(H)  BUN 8 - 23 mg/dL 20 19 15   Creatinine 0.61 - 1.24 mg/dL 1.55(H) 1.33(H) 1.06  Sodium 135 - 145 mmol/L 136 134(L) 138  Potassium 3.5 - 5.1 mmol/L 3.7 4.0 4.1  Chloride 98 - 111 mmol/L 104 99(L) 103  CO2 22 - 32 mmol/L 23 23 29   Calcium 8.9 - 10.3 mg/dL 8.4(L) 9.0 9.4  Total Protein 6.5 - 8.1 g/dL 6.1(L) - -  Total Bilirubin 0.3 - 1.2 mg/dL 0.9 - -  Alkaline Phos 38 - 126 U/L 51 - -  AST 15 - 41 U/L 30 - -  ALT 0 -  44 U/L 32 - -    Lipid Panel  No results found for: CHOL, TRIG, HDL, CHOLHDL, VLDL, LDLCALC, LDLDIRECT, LABVLDL  No results found for: HGBA1C No components found for: NTPROBNP No results found for: TSH  Cardiac Panel (last 3 results) No results for input(s): CKTOTAL, CKMB, TROPONINIHS, RELINDX in the last 72 hours.  IMPRESSION:    ICD-10-CM   1. Asymptomatic bilateral  carotid artery stenosis  I65.23   2. Abdominal aortic aneurysm (AAA) without rupture (HCC)  I71.4   3. Cigarette smoker  F17.210   4. Benign hypertension  I10   5. Mixed hyperlipidemia  E78.2      RECOMMENDATIONS: Randy Morton is a 75 y.o. male whose past medical history and cardiovascular risk factors include: hypertension, hyperlipidemia, GERD, Barrett's esophagus, abdominal aortic aneurysm and atherosclerosis, coronary artery atherosclerosis noted on nongated CT.  Bilateral carotid artery atherosclerosis:  Continue aspirin and statin therapy. Repeat study scheduled for 10/22/2020.    Abdominal aortic aneurysm, without rupture and asymptomatic:  Follow-up abdominal duplex reviewed with patient and his wife.   Educated on the importance of complete smoking cessation.   Recommended SBP goal between 120-130 mmHg or lower is tolerated without becoming symptomatic.   Currently on Lipitor, lipids are currently managed by primary team.   Recommend LDL  70mg /dL.   Benign essential hypertension: Currently managed by primary team.  Mixed hyperlipidemia: See above. Does not endorse any evidence of myalgias.  FINAL MEDICATION LIST END OF ENCOUNTER: No orders of the defined types were placed in this encounter.   There are no discontinued medications.   Current Outpatient Medications:  .  aspirin 81 MG chewable tablet, Chew 162 mg by mouth daily., Disp: , Rfl:  .  atorvastatin (LIPITOR) 40 MG tablet, Take 80 mg by mouth daily., Disp: , Rfl: 2 .  pantoprazole (PROTONIX) 40 MG tablet, Take 40 mg by mouth daily. Barrett's Esophagus, Disp: , Rfl: 2 .  telmisartan (MICARDIS) 80 MG tablet, Take 40 mg by mouth daily., Disp: , Rfl:   No orders of the defined types were placed in this encounter.  Total time spent: 22 minutes.   --Continue cardiac medications as reconciled in final medication list. --Return in about 6 months (around 10/29/2020) for Follow up carotid artery disease. . Or  sooner if needed. --Continue follow-up with your primary care physician regarding the management of your other chronic comorbid conditions.  Patient's questions and concerns were addressed to his satisfaction. He voices understanding of the instructions provided during this encounter.   This note was created using a voice recognition software as a result there may be grammatical errors inadvertently enclosed that do not reflect the nature of this encounter. Every attempt is made to correct such errors.  Rex Kras, [place], [facility]  Pager: [phone] Office: [phone]

## 2020-05-28 DIAGNOSIS — M25512 Pain in left shoulder: Secondary | ICD-10-CM | POA: Diagnosis not present

## 2020-07-27 DIAGNOSIS — Z125 Encounter for screening for malignant neoplasm of prostate: Secondary | ICD-10-CM | POA: Diagnosis not present

## 2020-07-27 DIAGNOSIS — I10 Essential (primary) hypertension: Secondary | ICD-10-CM | POA: Diagnosis not present

## 2020-07-27 DIAGNOSIS — E7800 Pure hypercholesterolemia, unspecified: Secondary | ICD-10-CM | POA: Diagnosis not present

## 2020-07-30 DIAGNOSIS — Z0001 Encounter for general adult medical examination with abnormal findings: Secondary | ICD-10-CM | POA: Diagnosis not present

## 2020-07-30 DIAGNOSIS — R413 Other amnesia: Secondary | ICD-10-CM | POA: Diagnosis not present

## 2020-07-30 DIAGNOSIS — K219 Gastro-esophageal reflux disease without esophagitis: Secondary | ICD-10-CM | POA: Diagnosis not present

## 2020-07-30 DIAGNOSIS — I10 Essential (primary) hypertension: Secondary | ICD-10-CM | POA: Diagnosis not present

## 2020-07-31 ENCOUNTER — Other Ambulatory Visit: Payer: Self-pay | Admitting: Internal Medicine

## 2020-07-31 DIAGNOSIS — R413 Other amnesia: Secondary | ICD-10-CM

## 2020-08-06 DIAGNOSIS — R0989 Other specified symptoms and signs involving the circulatory and respiratory systems: Secondary | ICD-10-CM | POA: Diagnosis not present

## 2020-08-06 DIAGNOSIS — I739 Peripheral vascular disease, unspecified: Secondary | ICD-10-CM | POA: Diagnosis not present

## 2020-08-13 ENCOUNTER — Ambulatory Visit
Admission: RE | Admit: 2020-08-13 | Discharge: 2020-08-13 | Disposition: A | Discharge status: Home / Self Care | Payer: Medicare Other | Source: Ambulatory Visit | Attending: Internal Medicine | Admitting: Internal Medicine

## 2020-08-13 DIAGNOSIS — G319 Degenerative disease of nervous system, unspecified: Secondary | ICD-10-CM | POA: Diagnosis not present

## 2020-08-13 DIAGNOSIS — R413 Other amnesia: Secondary | ICD-10-CM

## 2020-08-13 DIAGNOSIS — R9082 White matter disease, unspecified: Secondary | ICD-10-CM | POA: Diagnosis not present

## 2020-08-13 DIAGNOSIS — I6389 Other cerebral infarction: Secondary | ICD-10-CM | POA: Diagnosis not present

## 2020-08-25 DIAGNOSIS — Z8673 Personal history of transient ischemic attack (TIA), and cerebral infarction without residual deficits: Secondary | ICD-10-CM | POA: Diagnosis not present

## 2020-08-25 DIAGNOSIS — I10 Essential (primary) hypertension: Secondary | ICD-10-CM | POA: Diagnosis not present

## 2020-08-28 ENCOUNTER — Encounter: Payer: Self-pay | Admitting: Neurology

## 2020-09-17 DIAGNOSIS — I10 Essential (primary) hypertension: Secondary | ICD-10-CM | POA: Diagnosis not present

## 2020-11-16 NOTE — Progress Notes (Deleted)
   NEUROLOGY CONSULTATION NOTE  ALGIRD RIETZ MRN: [id number] DOB: [birth year]  Referring provider: Deland Pretty, MD Primary care provider: Deland Pretty, MD  Reason for consult:  memory problems  Assessment/Plan:   ***   Subjective:  Randy Morton is a 75 year old ***-handed male with HTN, HLD, AAA, left carotid artery stenosis who presents for memory problems.  History supplemented by referring provider's note.  ***.  He had an MRI of the brain on 08/13/2020 which was personally reviewed and revealed mild generalized atrophy as well as chronic small vessel ischemic changes in the cerebral hemispheres and remote right cerebellar lacunar infarct.     PAST MEDICAL HISTORY: Past Medical History:  Diagnosis Date   Abdominal aortic aneurysm (AAA) (HCC)    Known 1.5cm AAA   Barrett esophagus    Cancer (HCC)    Carotid artery stenosis, symptomatic, left    COPD (chronic obstructive pulmonary disease) (HCC)    Hypercholesteremia    Hypertension    Tobacco user     PAST SURGICAL HISTORY: Past Surgical History:  Procedure Laterality Date   ADENOIDECTOMY     PROSTATECTOMY     TONSILLECTOMY      MEDICATIONS: Current Outpatient Medications on File Prior to Visit  Medication Sig Dispense Refill   aspirin 81 MG chewable tablet Chew 162 mg by mouth daily.     atorvastatin (LIPITOR) 40 MG tablet Take 80 mg by mouth daily.  2   pantoprazole (PROTONIX) 40 MG tablet Take 40 mg by mouth daily. Barrett's Esophagus  2   telmisartan (MICARDIS) 80 MG tablet Take 40 mg by mouth daily.     No current facility-administered medications on file prior to visit.    ALLERGIES: No Known Allergies  FAMILY HISTORY: Family History  Problem Relation Age of Onset   CVA Mother    Lung cancer Father     Objective:  *** General: No acute distress.  Patient appears well-groomed.   Head:  Normocephalic/atraumatic Eyes:  fundi examined but not visualized Neck: supple, no paraspinal  tenderness, full range of motion Back: No paraspinal tenderness Heart: regular rate and rhythm Lungs: Clear to auscultation bilaterally. Vascular: No carotid bruits. Neurological Exam: Mental status: alert and oriented to person, place, and time, recent and remote memory intact, fund of knowledge intact, attention and concentration intact, speech fluent and not dysarthric, language intact. Cranial nerves: CN I: not tested CN II: pupils equal, round and reactive to light, visual fields intact CN III, IV, VI:  full range of motion, no nystagmus, no ptosis CN V: facial sensation intact. CN VII: upper and lower face symmetric CN VIII: hearing intact CN IX, X: gag intact, uvula midline CN XI: sternocleidomastoid and trapezius muscles intact CN XII: tongue midline Bulk & Tone: normal, no fasciculations. Motor:  muscle strength 5/5 throughout Sensation:  Pinprick, temperature and vibratory sensation intact. Deep Tendon Reflexes:  2+ throughout,  toes downgoing.   Finger to nose testing:  Without dysmetria.   Heel to shin:  Without dysmetria.   Gait:  Normal station and stride.  Romberg negative.    Thank you for allowing me to take part in the care of this patient.  Metta Clines, DO  CC: Deland Pretty, MD

## 2020-11-17 ENCOUNTER — Ambulatory Visit: Payer: Medicare Other | Admitting: Physician Assistant

## 2020-11-17 DIAGNOSIS — I6381 Other cerebral infarction due to occlusion or stenosis of small artery: Secondary | ICD-10-CM

## 2020-11-17 DIAGNOSIS — R413 Other amnesia: Secondary | ICD-10-CM | POA: Insufficient documentation

## 2020-11-17 DIAGNOSIS — Z8673 Personal history of transient ischemic attack (TIA), and cerebral infarction without residual deficits: Secondary | ICD-10-CM | POA: Insufficient documentation

## 2020-11-17 HISTORY — DX: Other cerebral infarction due to occlusion or stenosis of small artery: I63.81

## 2020-11-24 ENCOUNTER — Ambulatory Visit: Payer: Medicare Other | Admitting: Physician Assistant

## 2020-11-24 ENCOUNTER — Other Ambulatory Visit: Payer: Self-pay

## 2020-11-24 ENCOUNTER — Encounter: Payer: Self-pay | Admitting: Physician Assistant

## 2020-11-24 VITALS — BP 131/77 | HR 74 | Resp 20 | Ht 69.0 in | Wt 191.0 lb

## 2020-11-24 DIAGNOSIS — R413 Other amnesia: Secondary | ICD-10-CM | POA: Diagnosis not present

## 2020-11-24 MED ORDER — DONEPEZIL HCL 10 MG PO TABS: ORAL_TABLET | ORAL | 3 refills | Status: DC

## 2020-11-24 NOTE — Patient Instructions (Addendum)
It was a pleasure to see you today at our office.   Recommendations:  Neurocognitive evaluation at our office We will start donepezil half tablet ('5mg'$ ) daily for 2  weeks.  If you are tolerating the medication, then after 2 weeks, we will increase the dose to a full tablet of 10 mg daily.  Side effects include nausea, vomiting, diarrhea, vivid dreams, and muscle cramps.  Please call the clinic if you experience any of these symptoms.  Follow up once the results of the above are available   RECOMMENDATIONS FOR ALL PATIENTS WITH MEMORY PROBLEMS: 1. Continue to exercise (Recommend 30 minutes of walking everyday, or 3 hours every week) 2. Increase social interactions - continue going to [place] and enjoy social gatherings with friends and family 3. Eat healthy, avoid fried foods and eat more fruits and vegetables 4. Maintain adequate blood pressure, blood sugar, and blood cholesterol level. Reducing the risk of stroke and cardiovascular disease also helps promoting better memory. 5. Avoid stressful situations. Live a simple life and avoid aggravations. Organize your time and prepare for the next day in anticipation. 6. Sleep well, avoid any interruptions of sleep and avoid any distractions in the bedroom that may interfere with adequate sleep quality 7. Avoid sugar, avoid sweets as there is a strong link between excessive sugar intake, diabetes, and cognitive impairment We discussed the Mediterranean diet, which has been shown to help patients reduce the risk of progressive memory disorders and reduces cardiovascular risk. This includes eating fish, eat fruits and green leafy vegetables, nuts like almonds and hazelnuts, walnuts, and also use olive oil. Avoid fast foods and fried foods as much as possible. Avoid sweets and sugar as sugar use has been linked to worsening of memory function.  There is always a concern of gradual progression of memory problems. If this is the case, then we may need to  adjust level of care according to patient needs. Support, both to the patient and caregiver, should then be put into place.      You have been referred for a neuropsychological evaluation (i.e., evaluation of memory and thinking abilities). Please bring someone with you to this appointment if possible, as it is helpful for the doctor to hear from both you and another adult who knows you well. Please bring eyeglasses and hearing aids if you wear them.    The evaluation will take approximately 3 hours and has two parts:   The first part is a clinical interview with the neuropsychologist (Dr. Melvyn Novas or Dr. Nicole Kindred). During the interview, the neuropsychologist will speak with you and the individual you brought to the appointment.    The second part of the evaluation is testing with the doctor's technician Hinton Dyer or Maudie Mercury). During the testing, the technician will ask you to remember different types of material, solve problems, and answer some questionnaires. Your family member will not be present for this portion of the evaluation.   Please note: We must reserve several hours of the neuropsychologist's time and the psychometrician's time for your evaluation appointment. As such, there is a No-Show fee of $100. If you are unable to attend any of your appointments, please contact our office as soon as possible to reschedule.    FALL PRECAUTIONS: Be cautious when walking. Scan the area for obstacles that may increase the risk of trips and falls. When getting up in the mornings, sit up at the edge of the bed for a few minutes before getting out of bed. Consider elevating  the bed at the head end to avoid drop of blood pressure when getting up. Walk always in a well-lit room (use night lights in the walls). Avoid area rugs or power cords from appliances in the middle of the walkways. Use a walker or a cane if necessary and consider physical therapy for balance exercise. Get your eyesight checked  regularly.  FINANCIAL OVERSIGHT: Supervision, especially oversight when making financial decisions or transactions is also recommended.  HOME SAFETY: Consider the safety of the kitchen when operating appliances like stoves, microwave oven, and blender. Consider having supervision and share cooking responsibilities until no longer able to participate in those. Accidents with firearms and other hazards in the house should be identified and addressed as well.   ABILITY TO BE LEFT ALONE: If patient is unable to contact 911 operator, consider using LifeLine, or when the need is there, arrange for someone to stay with patients. Smoking is a fire hazard, consider supervision or cessation. Risk of wandering should be assessed by caregiver and if detected at any point, supervision and safe proof recommendations should be instituted.  MEDICATION SUPERVISION: Inability to self-administer medication needs to be constantly addressed. Implement a mechanism to ensure safe administration of the medications.   DRIVING: Regarding driving, in patients with progressive memory problems, driving will be impaired. We advise to have someone else do the driving if trouble finding directions or if minor accidents are reported. Independent driving assessment is available to determine safety of driving.   If you are interested in the driving assessment, you can contact the following:  [employer] in [place]  [place] [phone] or [phone]    [place] refers to food and lifestyle choices that are based on the traditions of countries located on the [employer]. This way of eating has been shown to help prevent certain conditions and improve outcomes for people who have chronic diseases, like kidney disease and heart disease. What are tips for following this  plan? Lifestyle  Cook and eat meals together with your family, when possible. Drink enough fluid to keep your urine clear or pale yellow. Be physically active every day. This includes: Aerobic exercise like running or swimming. Leisure activities like gardening, walking, or housework. Get 7-8 hours of sleep each night. If recommended by your health care provider, drink red wine in moderation. This means 1 glass a day for nonpregnant women and 2 glasses a day for men. A glass of wine equals 5 oz (150 mL). Reading food labels  Check the serving size of packaged foods. For foods such as rice and pasta, the serving size refers to the amount of cooked product, not dry. Check the total fat in packaged foods. Avoid foods that have saturated fat or trans fats. Check the ingredients list for added sugars, such as corn syrup. Shopping  At the grocery store, buy most of your food from the areas near the walls of the store. This includes: Fresh fruits and vegetables (produce). Grains, beans, nuts, and seeds. Some of these may be available in unpackaged forms or large amounts (in bulk). Fresh seafood. Poultry and eggs. Low-fat dairy products. Buy whole ingredients instead of prepackaged foods. Buy fresh fruits and vegetables in-season from local farmers markets. Buy frozen fruits and vegetables in resealable bags. If you do not have access to quality fresh seafood, buy precooked frozen shrimp or canned fish, such as tuna, salmon, or  sardines. Buy small amounts of raw or cooked vegetables, salads, or olives from the deli or salad bar at your store. Stock your pantry so you always have certain foods on hand, such as olive oil, canned tuna, canned tomatoes, rice, pasta, and beans. Cooking  Cook foods with extra-virgin olive oil instead of using butter or other vegetable oils. Have meat as a side dish, and have vegetables or grains as your main dish. This means having meat in small portions or adding  small amounts of meat to foods like pasta or stew. Use beans or vegetables instead of meat in common dishes like chili or lasagna. Experiment with different cooking methods. Try roasting or broiling vegetables instead of steaming or sauteing them. Add frozen vegetables to soups, stews, pasta, or rice. Add nuts or seeds for added healthy fat at each meal. You can add these to yogurt, salads, or vegetable dishes. Marinate fish or vegetables using olive oil, lemon juice, garlic, and fresh herbs. Meal planning  Plan to eat 1 vegetarian meal one day each week. Try to work up to 2 vegetarian meals, if possible. Eat seafood 2 or more times a week. Have healthy snacks readily available, such as: Vegetable sticks with hummus. Greek yogurt. Fruit and nut trail mix. Eat balanced meals throughout the week. This includes: Fruit: 2-3 servings a day Vegetables: 4-5 servings a day Low-fat dairy: 2 servings a day Fish, poultry, or lean meat: 1 serving a day Beans and legumes: 2 or more servings a week Nuts and seeds: 1-2 servings a day Whole grains: 6-8 servings a day Extra-virgin olive oil: 3-4 servings a day Limit red meat and sweets to only a few servings a month What are my food choices? Mediterranean diet Recommended Grains: Whole-grain pasta. Brown rice. Bulgar wheat. Polenta. Couscous. Whole-wheat bread. [place]. Vegetables: Artichokes. Beets. Broccoli. Cabbage. Carrots. Eggplant. Green beans. Chard. Kale. Spinach. Onions. Leeks. Peas. Squash. Tomatoes. Peppers. Radishes. Fruits: Apples. Apricots. Avocado. Berries. Bananas. Cherries. Dates. Figs. Grapes. Lemons. Melon. Oranges. Peaches. Plums. Pomegranate. Meats and other protein foods: Beans. Almonds. Sunflower seeds. Pine nuts. Peanuts. [place]. Salmon. Scallops. Shrimp. [place]. Tilapia. Clams. Oysters. Eggs. Dairy: Low-fat milk. Cheese. Greek yogurt. Beverages: Water. Red wine. Herbal tea. Fats and oils: Extra virgin olive oil. Avocado  oil. Grape seed oil. Sweets and desserts: [place] yogurt with honey. Baked apples. Poached pears. Trail mix. Seasoning and other foods: Basil. Cilantro. Coriander. Cumin. Mint. Parsley. Sage. Rosemary. Tarragon. Garlic. Oregano. Thyme. Pepper. Balsalmic vinegar. Tahini. Hummus. Tomato sauce. Olives. Mushrooms. Limit these Grains: Prepackaged pasta or rice dishes. Prepackaged cereal with added sugar. Vegetables: Deep fried potatoes (french fries). Fruits: Fruit canned in syrup. Meats and other protein foods: Beef. Pork. Lamb. Poultry with skin. Hot dogs. [place] Salines. Dairy: Ice cream. Sour cream. Whole milk. Beverages: Juice. Sugar-sweetened soft drinks. Beer. Liquor and spirits. Fats and oils: Butter. Canola oil. Vegetable oil. Beef fat (tallow). Lard. Sweets and desserts: Cookies. Cakes. Pies. Candy. Seasoning and other foods: Mayonnaise. Premade sauces and marinades. The items listed may not be a complete list. Talk with your dietitian about what dietary choices are right for you. Summary The Mediterranean diet includes both food and lifestyle choices. Eat a variety of fresh fruits and vegetables, beans, nuts, seeds, and whole grains. Limit the amount of red meat and sweets that you eat. Talk with your health care provider about whether it is safe for you to drink red wine in moderation. This means 1 glass a day for nonpregnant women and 2 glasses  a day for men. A glass of wine equals 5 oz (150 mL). This information is not intended to replace advice given to you by your health care provider. Make sure you discuss any questions you have with your health care provider. Document Released: 11/12/2015 Document Revised: 12/15/2015 Document Reviewed: 11/12/2015 Elsevier Interactive Patient Education  2017 [employer].

## 2020-11-24 NOTE — Progress Notes (Addendum)
Assessment/Plan:   Randy Morton is a 75 y.o. year old male with risk factors including  age, hypertension, hyperlipidemia, COPD, tobacco use, h/o CVA , carotid artery stenosis, seen today for evaluation of memory loss.  MRI brain on 08/13/2020 shows mild generalized atrophy and white matter disease is mildly advanced for age. This likely reflects the sequela of chronic microvascular ischemia. MoCA today is 20/30 with deficiencies in visuospatial/executive memory, and delayed recall  2/5, orientation 6/6 suggestive of mild cognitive impairment versus mild dementia.    Recommendations:   Memory Loss   Discussed safety both in and out of the home.  Neurocognitive testing to further delineate cognitive concerns and determine underlying cause of memory changes such as potential contribution of sleep, anxiety or depression,  Discussed the importance of regular daily schedule with inclusion of crossword puzzles to maintain brain function.  We will start donepezil half tablet ('5mg'$ ) daily for 2  weeks.  Then will increase the dose to a full tablet of 10 mg daily.  Side effects discussed  Monitor mood with PCP.  Obtain lab results from Randy Morton (next weeks appt) Stay active at least 30 minutes at least 3 times a week.  Naps should be scheduled and should be no longer than 60 minutes and should not occur after 2 PM.  Mediterranean diet is recommended  Folllow up once results above are available   Subjective:    The patient is seen in neurologic consultation at the request of Randy Pretty, MD for the evaluation of memory.  The patient is accompanied by wife Randy Morton who supplements the history.He is a pleasant 75 y.o. year old male who has had memory issues for about 10 years according to wife, when she began noticing that he "was forgetting little things, such as dates, names, asking the same questions 2 or 3 times, and initially attributed to old age ".  Over the last 2 years, the patient, who  runs an executive recruiting business firm, reported forgetting the names of his clients, which was of concern to him.  He also became more frustrated, he had a longer episodes, and irritability especially over the last 1 year.  According to his children, he has become more defensive as well.  He has become less hungry, having experienced weight loss.  Overall, he sleeps well between 8 to 10 hours, except when he has to get up to go to the bathroom.  Occasionally he has vivid dreams, but he does have restless leg syndrome, so he does not know if he has any acting out.  He denies any sleepwalking, hallucinations, or paranoia.  He denies leaving objects in unusual places.  He is independent of bathing and dressing.  He has to write down the medications to get the proper dosage, according to Randy Morton-Patient denies.  He does not cook, has never done so.  He ambulates without difficulty without the use of a walker or a cane, denies any falls.  He did have a history of concussions during his college years in high school years, where he played football.  He continues to drive, without getting lost.  He likes to use his GPS.  He denies any headaches, double vision, dizziness, focal numbness or tingling, unilateral weakness or tremors.  He states that the closest thing to his stroke or mini stroke was about 3 years ago, when he was hospitalized with flu, where he could not finish the sentences, and his wife had to do that for  him, but he denies any of other symptoms of strokes during that time.  He denies anosmia.  Denies a history of sleep apnea, alcohol, tobacco.  He has a family history of mother with vascular dementia, and brother with Lewy body who died in Jul 03, 2014.  He does not enjoy outdoor activities, and for fun, he likes to be running his business.    Labs   MRI brain 08/13/2020 1. No acute intracranial abnormality. 2. Mild generalized atrophy and white matter disease is mildly advanced for age. This likely reflects  the sequela of chronic microvascular ischemia. 3. Remote lacunar infarct is present in the right cerebellum. Brainstem and cerebellum are otherwise within normal limits.  No Known Allergies  Current Outpatient Medications  Medication Instructions   aspirin 162 mg, Oral, Daily   atorvastatin (LIPITOR) 80 mg, Oral, Daily   donepezil (ARICEPT) 10 MG tablet Take half tablet (5 mg) daily for 2 weeks, then increase to the full tablet at 10 mg daily   pantoprazole (PROTONIX) 40 mg, Oral, Daily, Barrett's Esophagus   telmisartan (MICARDIS) 40 mg, Oral, Daily   valsartan (DIOVAN) 320 mg, Oral, Daily     VITALS:   Vitals:   11/24/20 1424  BP: 131/77  Pulse: 74  Resp: 20  SpO2: 97%  Weight: 191 lb (86.6 kg)  Height: '5\' 9"'$  (1.753 m)   No flowsheet data found.  PHYSICAL EXAM   HEENT:  Normocephalic, atraumatic. The mucous membranes are moist. The superficial temporal arteries are without ropiness or tenderness. Cardiovascular: Regular rate and rhythm. Lungs: Clear to auscultation bilaterally. Neck: There are no carotid bruits noted bilaterally.  NEUROLOGICAL: [place] Cognitive Assessment  11/24/2020  Visuospatial/ Executive (0/5) 1  Naming (0/3) 3  Attention: Read list of digits (0/2) 2  Attention: Read list of letters (0/1) 1  Attention: Serial 7 subtraction starting at 100 (0/3) 2  Language: Repeat phrase (0/2) 1  Language : Fluency (0/1) 1  Abstraction (0/2) 1  Delayed Recall (0/5) 2  Orientation (0/6) 6  Total 20  Adjusted Score (based on education) 20   No flowsheet data found.  No flowsheet data found.   Orientation:  Alert and oriented to person, place and time. No aphasia or dysarthria. Fund of knowledge is appropriate. Recent memory impaired and remote memory intact.  Attention and concentration are normal.  Able to name objects and repeat phrases. Delayed recall  2/6 Cranial nerves: There is good facial symmetry. Extraocular muscles are intact and visual fields are  full to confrontational testing. Speech is fluent and clear. Soft palate rises symmetrically and there is no tongue deviation. Hearing is intact to conversational tone. Tone: Tone is good throughout. Sensation: Sensation is intact to light touch and pinprick throughout. Vibration is intact at the bilateral big toe.There is no extinction with double simultaneous stimulation. There is no sensory dermatomal level identified. Coordination: The patient has no difficulty with RAM's or FNF bilaterally. Normal finger to nose  Motor: Strength is 5/5 in the bilateral upper and lower extremities. There is no pronator drift. There are no fasciculations noted. DTR's: Deep tendon reflexes are 2/4 at the bilateral biceps, triceps, brachioradialis, patella and achilles.  Plantar responses are downgoing bilaterally. Gait and Station: The patient is able to ambulate without difficulty.The patient is able to heel toe walk without any difficulty.The patient is able to ambulate in a tandem fashion. The patient is able to stand in the Romberg position.     Thank you for allowing [place] the opportunity  to participate in the care of this nice patient. Please do not hesitate to contact us for any questions or concerns.   Total time spent on today's visit was 60 minutes, including both face-to-face time and nonface-to-face time.  Time included that spent on review of records (prior notes available to me/labs/imaging if pertinent), discussing treatment and goals, answering patient's questions and coordinating care.  Cc:  Randy Pretty, MD  Sharene Butters 11/24/2020 3:51 PM

## 2020-11-30 DIAGNOSIS — H25813 Combined forms of age-related cataract, bilateral: Secondary | ICD-10-CM | POA: Diagnosis not present

## 2020-12-14 DIAGNOSIS — D235 Other benign neoplasm of skin of trunk: Secondary | ICD-10-CM | POA: Diagnosis not present

## 2020-12-14 DIAGNOSIS — L821 Other seborrheic keratosis: Secondary | ICD-10-CM | POA: Diagnosis not present

## 2020-12-14 DIAGNOSIS — Z85828 Personal history of other malignant neoplasm of skin: Secondary | ICD-10-CM | POA: Diagnosis not present

## 2020-12-14 DIAGNOSIS — Z85820 Personal history of malignant melanoma of skin: Secondary | ICD-10-CM | POA: Diagnosis not present

## 2021-01-20 DIAGNOSIS — H43813 Vitreous degeneration, bilateral: Secondary | ICD-10-CM | POA: Diagnosis not present

## 2021-01-20 DIAGNOSIS — H25013 Cortical age-related cataract, bilateral: Secondary | ICD-10-CM | POA: Diagnosis not present

## 2021-01-20 DIAGNOSIS — H18593 Other hereditary corneal dystrophies, bilateral: Secondary | ICD-10-CM | POA: Diagnosis not present

## 2021-01-20 DIAGNOSIS — H2513 Age-related nuclear cataract, bilateral: Secondary | ICD-10-CM | POA: Diagnosis not present

## 2021-02-10 ENCOUNTER — Encounter: Payer: Medicare Other | Admitting: Psychology

## 2021-03-08 DIAGNOSIS — H18593 Other hereditary corneal dystrophies, bilateral: Secondary | ICD-10-CM | POA: Diagnosis not present

## 2021-03-08 DIAGNOSIS — H25013 Cortical age-related cataract, bilateral: Secondary | ICD-10-CM | POA: Diagnosis not present

## 2021-03-08 DIAGNOSIS — H43813 Vitreous degeneration, bilateral: Secondary | ICD-10-CM | POA: Diagnosis not present

## 2021-03-08 DIAGNOSIS — H2513 Age-related nuclear cataract, bilateral: Secondary | ICD-10-CM | POA: Diagnosis not present

## 2021-04-12 DIAGNOSIS — Z13810 Encounter for screening for upper gastrointestinal disorder: Secondary | ICD-10-CM | POA: Diagnosis not present

## 2021-04-12 DIAGNOSIS — D123 Benign neoplasm of transverse colon: Secondary | ICD-10-CM | POA: Diagnosis not present

## 2021-04-12 DIAGNOSIS — K2270 Barrett's esophagus without dysplasia: Secondary | ICD-10-CM | POA: Diagnosis not present

## 2021-04-12 DIAGNOSIS — K5730 Diverticulosis of large intestine without perforation or abscess without bleeding: Secondary | ICD-10-CM | POA: Diagnosis not present

## 2021-04-12 DIAGNOSIS — Z86010 Personal history of colonic polyps: Secondary | ICD-10-CM | POA: Diagnosis not present

## 2021-04-12 DIAGNOSIS — K449 Diaphragmatic hernia without obstruction or gangrene: Secondary | ICD-10-CM | POA: Diagnosis not present

## 2021-04-12 DIAGNOSIS — K219 Gastro-esophageal reflux disease without esophagitis: Secondary | ICD-10-CM | POA: Diagnosis not present

## 2021-04-12 DIAGNOSIS — K3189 Other diseases of stomach and duodenum: Secondary | ICD-10-CM | POA: Diagnosis not present

## 2021-04-12 DIAGNOSIS — K649 Unspecified hemorrhoids: Secondary | ICD-10-CM | POA: Diagnosis not present

## 2021-04-12 DIAGNOSIS — K5710 Diverticulosis of small intestine without perforation or abscess without bleeding: Secondary | ICD-10-CM | POA: Diagnosis not present

## 2021-04-14 DIAGNOSIS — D123 Benign neoplasm of transverse colon: Secondary | ICD-10-CM | POA: Diagnosis not present

## 2021-04-23 ENCOUNTER — Other Ambulatory Visit: Payer: Self-pay

## 2021-04-23 ENCOUNTER — Ambulatory Visit (INDEPENDENT_AMBULATORY_CARE_PROVIDER_SITE_OTHER): Payer: Medicare Other | Admitting: Psychology

## 2021-04-23 ENCOUNTER — Encounter: Payer: Self-pay | Admitting: Psychology

## 2021-04-23 ENCOUNTER — Ambulatory Visit: Payer: Medicare Other

## 2021-04-23 DIAGNOSIS — I10 Essential (primary) hypertension: Secondary | ICD-10-CM | POA: Insufficient documentation

## 2021-04-23 DIAGNOSIS — K5730 Diverticulosis of large intestine without perforation or abscess without bleeding: Secondary | ICD-10-CM | POA: Insufficient documentation

## 2021-04-23 DIAGNOSIS — Z860100 Personal history of colon polyps, unspecified: Secondary | ICD-10-CM | POA: Insufficient documentation

## 2021-04-23 DIAGNOSIS — G3184 Mild cognitive impairment, so stated: Secondary | ICD-10-CM | POA: Diagnosis not present

## 2021-04-23 DIAGNOSIS — R4189 Other symptoms and signs involving cognitive functions and awareness: Secondary | ICD-10-CM

## 2021-04-23 DIAGNOSIS — K5710 Diverticulosis of small intestine without perforation or abscess without bleeding: Secondary | ICD-10-CM | POA: Insufficient documentation

## 2021-04-23 DIAGNOSIS — I6522 Occlusion and stenosis of left carotid artery: Secondary | ICD-10-CM | POA: Insufficient documentation

## 2021-04-23 DIAGNOSIS — K219 Gastro-esophageal reflux disease without esophagitis: Secondary | ICD-10-CM | POA: Insufficient documentation

## 2021-04-23 DIAGNOSIS — I6381 Other cerebral infarction due to occlusion or stenosis of small artery: Secondary | ICD-10-CM | POA: Diagnosis not present

## 2021-04-23 DIAGNOSIS — E7800 Pure hypercholesterolemia, unspecified: Secondary | ICD-10-CM

## 2021-04-23 DIAGNOSIS — K2270 Barrett's esophagus without dysplasia: Secondary | ICD-10-CM | POA: Insufficient documentation

## 2021-04-23 HISTORY — DX: Mild cognitive impairment of uncertain or unknown etiology: G31.84

## 2021-04-23 NOTE — Progress Notes (Signed)
° °  Psychometrician Note   Cognitive testing was administered to Randy Morton by Cruzita Lederer, B.S. (psychometrist) under the supervision of Dr. Christia Reading, Ph.D., licensed psychologist on 04/23/2021. Mr. Kamara did not appear overtly distressed by the testing session per behavioral observation or responses across self-report questionnaires. Rest breaks were offered.    The battery of tests administered was selected by Dr. Christia Reading, Ph.D. with consideration to Mr. Stanislawski current level of functioning, the nature of his symptoms, emotional and behavioral responses during interview, level of literacy, observed level of motivation/effort, and the nature of the referral question. This battery was communicated to the psychometrist. Communication between Dr. Christia Reading, Ph.D. and the psychometrist was ongoing throughout the evaluation and Dr. Christia Reading, Ph.D. was immediately accessible at all times. Dr. Christia Reading, Ph.D. provided supervision to the psychometrist on the date of this service to the extent necessary to assure the quality of all services provided.    Randy Morton will return within approximately 1-2 weeks for an interactive feedback session with Dr. Melvyn Novas at which time his test performances, clinical impressions, and treatment recommendations will be reviewed in detail. Mr. Markwood understands he can contact our office should he require our assistance before this time.  A total of 120 minutes of billable time were spent face-to-face with Mr. Budai by the psychometrist. This includes both test administration and scoring time. Billing for these services is reflected in the clinical report generated by Dr. Christia Reading, Ph.D.  This note reflects time spent with the psychometrician and does not include test scores or any clinical interpretations made by Dr. Melvyn Novas. The full report will follow in a separate note.

## 2021-04-23 NOTE — Progress Notes (Addendum)
NEUROPSYCHOLOGICAL EVALUATION [place]. [facility] Department of Neurology  Date of Evaluation: April 23, 2021  Reason for Referral:   Randy Morton is a 76 y.o. right-handed Caucasian male referred by  Randy Butters, PA-C , to characterize his current cognitive functioning and assist with diagnostic clarity and treatment planning in the context of subjective cognitive decline.   Assessment and Plan:   Clinical Impression(s): Randy Morton pattern of performance is suggestive of primary impairments surrounding verbal fluency and essentially all aspects of learning and memory. Further performance variability was exhibited across executive functioning and visuospatial abilities. Performances were appropriate relative to age-matched peers across domains of processing speed, attention/concentration, safety/judgment, receptive language, and confrontation naming. Randy Morton continues to work full-time and denied difficulties completing instrumental activities of daily living (ADLs) independently. His wife, when interviewed separately, did not contradict this. As such, given evidence for cognitive dysfunction described above, he meets criteria for a Mild Neurocognitive Disorder ("mild cognitive impairment") at the present time.  The etiology for ongoing cognitive impairment is unclear. Despite this, consideration unfortunately needs to be given to Alzheimer's disease. Across memory testing, Randy Morton was essentially amnestic across all three memory tasks, with retention percentages ranging from 0-29%. While he was able to answer recognition questions relating to a previously read story well, he performed very poorly when recognizing information from list and figure memory tasks. Taken together, this certainly suggests concerns for the presence of rapid forgetting and an evolving memory storage deficit. Both of which are hallmark characteristics of Alzheimer's disease. Deficits  involving semantic fluency, executive functioning, and visuospatial abilities are common in typical disease progression. He did perform well across confrontation naming tasks which is encouraging. If truly present, he appears to be in earlier stages at the present time.   Behaviorally, Randy Morton does not display common characteristics of Lewy body dementia and largely amnestic memory would favor Alzheimer's disease over this presentation. He also does not exhibit concerning signs for frontotemporal dementia or another more rare parkinsonian condition. Prior neuroimaging did reveal mildly advanced for his age small vessel disease and a past lacunar infarct involving the right cerebellum. While cerebrovascular disease may exacerbate ongoing cognitive impairment, current memory loss is above and beyond what would be expected from these changes in isolation. Continued medical monitoring will be important moving forward.   Recommendations: A repeat neuropsychological evaluation in 18 months (or sooner if functional decline is noted) is recommended to assess the trajectory of future cognitive decline should it occur. This will also aid in future efforts towards improved diagnostic clarity.  Randy Morton has already been prescribed a medication aimed to address memory loss and concerns surrounding Alzheimer's disease (i.e., donepezil/Aricept). he is encouraged to continue taking this medication as prescribed. It is important to highlight that this medication has been shown to slow functional decline in some individuals. There is no current treatment which can stop or reverse cognitive decline when caused by a neurodegenerative illness.   I would strongly recommend that Randy Morton, his son (who is reportedly ready to take over the family business per his wife), and other relevant business partners develop a succession plan if not already done. It is my opinion that it would be prudent for Randy Morton to gradually  relinquish some work-related responsibilities and operate at a reduced role if not eventually retire (I understand that he is quite resistant to the idea of retirement). Given objective memory impairment, concerns that he is forgetting more than he  may realize or report are quite warranted, as are concerns that these could negatively impact the business moving forward.  Should there be a progression of current deficits over time, he is unlikely to regain any independent living skills lost. Therefore, it is recommended that he remain as involved as possible in all aspects of household chores, finances, and medication management, with supervision to ensure adequate performance. He will likely benefit from the establishment and maintenance of a routine in order to maximize his functional abilities over time.  It will be important for Randy Morton to have another person with him when in situations where he may need to process information, weigh the pros and cons of different options, and make decisions, in order to ensure that he fully understands and recalls all information to be considered.  Performance across neurocognitive testing is not a strong predictor of an individual's safety operating a motor vehicle. Should his family wish to pursue a formalized driving evaluation, they could reach out to the following agencies: [employer] in [place]: [phone] Driver Rehabilitative Services: [facility]: [place]: [phone] or [phone]  Randy Morton is encouraged to attend to lifestyle factors for brain health (e.g., regular physical exercise, good nutrition habits, regular participation in cognitively-stimulating activities, and general stress management techniques), which are likely to have benefits for both emotional adjustment and cognition. Optimal control of vascular risk factors (including safe cardiovascular exercise and adherence to  dietary recommendations) is encouraged. Continued participation in activities which provide mental stimulation and social interaction is also recommended.   Important information should be provided to Randy Morton in written format in all instances. This information should be placed in a highly frequented and easily visible location within his home to promote recall. External strategies such as written notes in a consistently used memory journal, visual and nonverbal auditory cues such as a calendar on the refrigerator or appointments with alarm, such as on a cell phone, can also help maximize recall.  To address problems with fluctuating attention, he may wish to consider:   -Avoiding external distractions when needing to concentrate   -Limiting exposure to fast paced environments with multiple sensory demands   -Writing down complicated information and using checklists   -Attempting and completing one task at a time (i.e., no multi-tasking)   -Verbalizing aloud each step of a task to maintain focus   -Reducing the amount of information considered at one time  Review of Records:   Randy Morton was seen by [facility] Randy Butters, PA-C) on 11/24/2020 for an evaluation of memory loss. Per his wife, memory concerns were said to be present for the past 10 years. Examples included forgetting little things, such as dates, names, or asking the same question 2-3 times. Initially, these were attributed to age-related changes. 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 He also reported increased frustration, while his family reported increased defensiveness. There was also report of unexplained weight loss. Significant sleep or mood concerns were denied. There was no report of REM behaviors, hallucinations, or paranoia. No balance concerns were noted and ADLs were largely described as intact. He  reported playing sports growing up and potentially sustaining some concussions during this time. He denied headaches, double vision, dizziness, focal numbness or tingling, unilateral weakness or tremors, anosmia, history of OSA, or a history of substance abuse. Performance on a brief cognitive screening instrument (  MOCA) was 20/30. Ultimately, Randy Morton was referred for a comprehensive neuropsychological evaluation to characterize his cognitive abilities and to assist with diagnostic clarity and treatment planning.   Head CT on 05/13/2018 in the context of "possible sepsis" was negative. Brain MRI on 08/14/2020 revealed mild generalized atrophy, mildly advanced for age small vessel ischemic changes, and a chronic lacunar infarct involving the right cerebellum.   Past Medical History:  Diagnosis Date   Abdominal aortic aneurysm (AAA)    Known 1.5cm   Barrett esophagus    Carotid artery stenosis, symptomatic, left    COPD (chronic obstructive pulmonary disease)    Diverticular disease of colon    Diverticulosis of small intestine    Gastroesophageal reflux disease    History of cancer    Hypercholesteremia    Hypertension    Lacunar infarction 11/17/2020   right cerebellum; seen on brain MRI   Personal history of colonic polyps    Tobacco use 01/29/2018    Past Surgical History:  Procedure Laterality Date   ADENOIDECTOMY     PROSTATECTOMY     TONSILLECTOMY      Current Outpatient Medications:    aspirin 81 MG chewable tablet, Chew 162 mg by mouth daily., Disp: , Rfl:    atorvastatin (LIPITOR) 40 MG tablet, Take 80 mg by mouth daily., Disp: , Rfl: 2   donepezil (ARICEPT) 10 MG tablet, Take half tablet (5 mg) daily for 2 weeks, then increase to the full tablet at 10 mg daily, Disp: 30 tablet, Rfl: 3   pantoprazole (PROTONIX) 40 MG tablet, Take 40 mg by mouth daily. Barrett's Esophagus, Disp: , Rfl: 2   telmisartan (MICARDIS) 80 MG tablet, Take 40 mg by mouth daily., Disp: , Rfl:     valsartan (DIOVAN) 320 MG tablet, Take 320 mg by mouth daily., Disp: , Rfl:   Clinical Interview:   The following information was obtained during a clinical interview with Randy Morton prior to cognitive testing. His wife was interviewed separately.   Cognitive Symptoms: Decreased short-term memory: Endorsed. Per his report, he described primary difficulties in recalling names of candidates involved in his business dealings. He stated that this information will come to him with time or if he is given a cue. Outside of this, he denied all other memory concerns, stating that these difficulties had been gradually declining over the past three years. Separately, his wife reported more pronounced memory loss, particularly in him repeating himself frequently or asking repetitive questions. She noted times where he seems to forget information that was said to him 3-5 minutes later. She also provided examples where he will not engage in memory games with his family, assumedly due to performance deficits. She noted that decline had seemed accelerated during the past year.  Decreased long-term memory: Denied. Decreased attention/concentration: Denied. Reduced processing speed: Denied. Difficulties with executive functions: Denied. He denied trouble with impulsivity or any significant personality changes. His wife noted that he has always had a shorter fuse, but that this seems much worse lately. She also noted that he can become quite defensive when addressing memory limitations.  Difficulties with emotion regulation: Denied. Difficulties with receptive language: Denied. Difficulties with word finding: Denied. Decreased visuoperceptual ability: Denied.  Difficulties completing ADLs: Denied. His wife largely agreed with this assessment. She did report that if asked directly, he would be unable to state all his medications or what they are prescribed to treat. She noted that they lay out their pills and he does  not  have trouble taking them appropriately. She also denied trouble with financial management. He continues to drive without issue but may seem less confident while navigating.   Additional Medical History: History of traumatic brain injury/concussion: Denied. His wife was less clear, highlighting that he played sports such as football growing up and may have sustained several unknown concussive injuries. History of stroke: Prior brain MRI revealed a chronic lacunar infarct involving the right cerebellum. This was likely asymptomatic.  History of seizure activity: Denied. History of known exposure to toxins: Denied. Symptoms of chronic pain: Denied. Experience of frequent headaches/migraines: Denied. Frequent instances of dizziness/vertigo: Denied.  Sensory changes: He wears glasses with benefit. His wife noted that he has upcoming cataract surgery scheduled. Other sensory changes/difficulties (e.g., hearing, taste, or smell) were denied.  Balance/coordination difficulties: Denied. Other motor difficulties: Denied.  Sleep History: Estimated hours obtained each night: 8-10 hours.  Difficulties falling asleep: Denied. Difficulties staying asleep: Denied. Feels rested and refreshed upon awakening: Endorsed "for the most part."  History of snoring: Denied. History of waking up gasping for air: Denied. Witnessed breath cessation while asleep: Denied.  History of vivid dreaming: Endorsed. Excessive movement while asleep: Endorsed. However, this was not viewed as excessive and attributed to a history of some restless leg symptoms.  Instances of acting out his dreams: Denied.  Psychiatric/Behavioral Health History: Depression: He described his current mood as "good" and denied to his knowledge any prior mental health concerns or diagnoses. Current or remote suicidal ideation, intent, or plan was denied.  Anxiety: Denied. Mania: Denied. Trauma History: Denied. Visual/auditory hallucinations:  Denied. Delusional thoughts: Denied.  Tobacco: Endorsed. He reported consuming 1/2 pack of cigarettes daily.  Alcohol: He reported very rare alcohol consumption (1-2 drinks per month) and denied a history of problematic alcohol abuse or dependence.  Recreational drugs: Denied.  Family History: Problem Relation Age of Onset   CVA Mother    Dementia Mother        vascular dementia   Lung cancer Father    Dementia Brother        Lewy body dementia; onset in 69s   This information was confirmed by Randy Morton.  Academic/Vocational History: Highest level of educational attainment: 16 years. He completed high school and earned a Bachelor's degree. He described himself as a good (A/B) student in academic settings. Higher level math was noted as a likely relative weakness.  History of developmental delay: Denied. History of grade repetition: Denied. Enrollment in special education courses: Denied. History of LD/ADHD: Denied.  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 Outside of trouble recalling the names of candidates on the spot, he denied any other work related performance issues or concerns. His wife noted that he continues to work five days per week, does not have any hobbies or interests outside of work, and is quite resistant to the idea of retirement.   Evaluation Results:   Behavioral Observations: Randy Morton was accompanied by his wife, arrived to his appointment on time, and was appropriately dressed and groomed. He appeared alert and oriented. Observed gait and station were within normal limits. Gross motor functioning appeared intact upon informal observation and no abnormal movements (e.g., tremors) were noted. His affect was generally relaxed and positive. Spontaneous speech was fluent and word finding difficulties were not observed during the clinical interview. Thought processes were coherent, organized, and normal in content. Insight into his  cognitive difficulties appeared quite limited in that objective cognitive impairment is far greater than  what he acknowledged during interview. It is difficult to know if he is truly unaware of ongoing impairment or actively denying these difficulties and trying to present himself in a favorable light.   During testing, sustained attention was appropriate. Task engagement and testing tolerance was variable. There were tasks where Randy Morton appeared to give up on easily. These generally represented more complex tasks which he exhibited greater difficulty (e.g., D-KEFS Color Word, Block Design, verbal fluency tasks). Across these instances, he would abruptly make a comment (e.g., "That's about it") and discontinue the task prematurely. Outside of this, Randy Morton was cooperative with the clinical interview and subsequent testing procedures.   Adequacy of Effort: The validity of neuropsychological testing is limited by the extent to which the individual being tested may be assumed to have exerted adequate effort during testing. Mr. Recendez expressed his intention to perform to the best of his abilities and exhibited adequate task engagement and persistence. Scores across stand-alone and embedded performance validity measures were within expectation. As such, the results of the current evaluation are believed to be a valid representation of Mr. Stegmann current cognitive functioning.  Test Results: Mr. Vanbergen was mildly disoriented at the time of the current evaluation. He incorrectly stated the current date and day of the week.   Intellectual abilities based upon educational and vocational attainment were estimated to be in the average range. Premorbid abilities were estimated to be within the average range based upon a single-word reading test.   Processing speed was mildly variable but overall appropriate, ranging from the below average to above average normative ranges. Basic attention was above average  to exceptionally high. More complex attention (e.g., working memory) was average. Executive functioning was exceptionally low to below average. He performed in the average range on a task assessing safety and judgment.  Assessed receptive language abilities were average. Likewise, Mr. Gains answered all questions asked of him appropriately during interview. Assessed expressive language was variable. Verbal fluency (both phonemic and semantic) was well below average to below average, while confrontation naming was average.   Assessed visuospatial/visuoconstructional abilities were variable, ranging from the well below average to average normative ranges. His drawing of a clock was appropriate. Points were lost on his copy of a complex figure due to mild visual distortions and one internal aspect being omitted entirely.     Learning (i.e., encoding) of novel verbal information was exceptionally low to below average. Spontaneous delayed recall (i.e., retrieval) of previously learned information was exceptionally low. Retention rates were 29% (raw score of 2) across a story learning task, 17% (raw score of 1) across a list learning task, and 0% across a figure drawing task. Performance across recognition tasks was appropriate across a story learning task but exceptionally low across list and figure tasks, suggesting limited evidence for information consolidation.   Results of emotional screening instruments suggested that recent symptoms of generalized anxiety were in the minimal range, while symptoms of depression were within normal limits. A screening instrument assessing recent sleep quality suggested the presence of minimal sleep dysfunction.  Tables of Scores:   Note: This summary of test scores accompanies the interpretive report and should not be considered in isolation without reference to the appropriate sections in the text. Descriptors are based on appropriate normative data and may be adjusted  based on clinical judgment. Terms such as "Within Normal Limits" and "Outside Normal Limits" are used when a more specific description of the test score cannot be determined.  Percentile - Normative Descriptor > 98 - Exceptionally High 91-97 - Well Above Average 75-90 - Above Average 25-74 - Average 9-24 - Below Average 2-8 - Well Below Average < 2 - Exceptionally Low       Validity:   DESCRIPTOR       Dot Counting Test: --- --- Within Normal Limits  RBANS Effort Index: --- --- Within Normal Limits  WAIS-IV Reliable Digit Span: --- --- Within Normal Limits       Orientation:      Raw Score Percentile   NAB Orientation, Form 1 26/29 --- ---       Cognitive Screening:      Raw Score Percentile   SLUMS: 19/30 --- ---       RBANS, Form A: Standard Score/ Scaled Score Percentile   Total Score 72 3 Well Below Average  Immediate Memory 69 2 Well Below Average    List Learning 6 9 Below Average    Story Memory 3 1 Exceptionally Low  Visuospatial/Constructional 75 5 Well Below Average    Figure Copy 5 5 Well Below Average    Line Orientation 14/20 17-25 Below Average to Average  Language 85 16 Below Average    Picture Naming 10/10 51-75 Average    Semantic Fluency 4 2 Well Below Average  Attention 109 73 Average    Digit Span 16 98 Exceptionally High    Coding 7 16 Below Average  Delayed Memory 52 <1 Exceptionally Low    List Recall 1/10 3-9 Well Below Average    List Recognition 16/20 <2 Exceptionally Low    Story Recall 3 1 Exceptionally Low    Story Recognition 11/12 47-68 Average    Figure Recall 1 <1 Exceptionally Low    Figure Recognition 2/8 2-3 Well Below Average        Intellectual Functioning:      Standard Score Percentile   Test of Premorbid Functioning: 98 45 Average       Attention/Executive Function:     Trail Making Test (TMT): Raw Score (T Score) Percentile     Part A 38 secs.,  0 errors (49) 46 Average    Part B 176 secs.,  5 errors (35) 7 Well  Below Average         Scaled Score Percentile   WAIS-IV Digit Span: 10 50 Average    Forward 12 75 Above Average    Backward 9 37 Average    Sequencing 8 25 Average       D-KEFS Color-Word Interference Test: Raw Score (Scaled Score) Percentile     Color Naming 30 secs. (11) 63 Average    Word Reading 19 secs. (13) 84 Above Average    Inhibition 92 secs. (7) 16 Below Average      Total Errors 0 errors (13) 84 Above Average    Inhibition/Switching Discontinued --- Impaired      Total Errors --- --- ---       D-KEFS Verbal Fluency Test: Raw Score (Scaled Score) Percentile     Letter Total Correct 22 (6) 9 Below Average    Category Total Correct 24 (6) 9 Below Average    Category Switching Total Correct 9 (6) 9 Below Average    Category Switching Accuracy 7 (6) 9 Below Average      Total Set Loss Errors 4 (8) 25 Average      Total Repetition Errors 3 (10) 50 Average       [employer] Test:  Raw Score Percentile     Categories (trials) 0 (64) 6-10 Well Below Average    Total Errors 31 14 Below Average    Perseverative Errors 18 18 Below Average    Non-Perseverative Errors 13 12 Below Average    Failure to Maintain Set 2 --- ---       NAB Executive Functions Module, Form 1: T Score Percentile     Judgment 45 31 Average       Language:     Verbal Fluency Test: Raw Score (T Score) Percentile     Phonemic Fluency (FAS) 22 (35) 7 Well Below Average    Animal Fluency 11 (31) 3 Well Below Average        NAB Language Module, Form 1: T Score Percentile     Auditory Comprehension 56 73 Average    Naming 30/31 (58) 79 Above Average       Visuospatial/Visuoconstruction:      Raw Score Percentile   Clock Drawing: 10/10 --- Within Normal Limits        Scaled Score Percentile   WAIS-IV Block Design: 11 63 Average       Mood and Personality:      Raw Score Percentile   Geriatric Depression Scale: 2 --- Within Normal Limits  Geriatric Anxiety Scale: 1 --- Minimal     Somatic 0 --- Minimal    Cognitive 1 --- Minimal    Affective 0 --- Minimal       Additional Questionnaires:      Raw Score Percentile   PROMIS Sleep Disturbance Questionnaire: 11 --- None to Slight   Informed Consent and Coding/Compliance:   The current evaluation represents a clinical evaluation for the purposes previously outlined by the referral source and is in no way reflective of a forensic evaluation.   Mr. Duva was provided with a verbal description of the nature and purpose of the present neuropsychological evaluation. Also reviewed were the foreseeable risks and/or discomforts and benefits of the procedure, limits of confidentiality, and mandatory reporting requirements of this provider. The patient was given the opportunity to ask questions and receive answers about the evaluation. Oral consent to participate was provided by the patient.   This evaluation was conducted by Christia Reading, Ph.D., ABPP-CN, board certified clinical neuropsychologist. Mr. Utz completed a clinical interview with Dr. Melvyn Novas, billed as one unit [phone], and 120 minutes of cognitive testing and scoring, billed as one unit [phone] and three additional units [id number]. Psychometrist Cruzita Lederer, B.S., assisted Dr. Melvyn Novas with test administration and scoring procedures. As a separate and discrete service, Dr. Melvyn Novas spent a total of 160 minutes in interpretation and report writing billed as one unit [phone] and two units [id number].

## 2021-04-26 ENCOUNTER — Encounter: Payer: Self-pay | Admitting: Psychology

## 2021-04-30 ENCOUNTER — Ambulatory Visit (INDEPENDENT_AMBULATORY_CARE_PROVIDER_SITE_OTHER): Payer: Medicare Other | Admitting: Psychology

## 2021-04-30 ENCOUNTER — Other Ambulatory Visit: Payer: Self-pay

## 2021-04-30 DIAGNOSIS — R4181 Age-related cognitive decline: Secondary | ICD-10-CM | POA: Diagnosis not present

## 2021-04-30 DIAGNOSIS — I6381 Other cerebral infarction due to occlusion or stenosis of small artery: Secondary | ICD-10-CM | POA: Diagnosis not present

## 2021-04-30 DIAGNOSIS — G3184 Mild cognitive impairment, so stated: Secondary | ICD-10-CM | POA: Diagnosis not present

## 2021-04-30 NOTE — Progress Notes (Signed)
° °  Neuropsychology Feedback Session Tillie Rung. [facility] Department of Neurology  Reason for Referral:   RAYLAND HAMED is a 76 y.o. right-handed Caucasian male referred by  Sharene Butters, PA-C , to characterize his current cognitive functioning and assist with diagnostic clarity and treatment planning in the context of subjective cognitive decline.   Feedback:   Mr. Taboada completed a comprehensive neuropsychological evaluation on 04/23/2021. Please refer to that encounter for the full report and recommendations. Briefly, results suggested primary impairments surrounding verbal fluency and essentially all aspects of learning and memory. Further performance variability was exhibited across executive functioning and visuospatial abilities. The etiology for ongoing cognitive impairment is unclear. Despite this, consideration unfortunately needs to be given to Alzheimer's disease. Across memory testing, Mr. Beckles was essentially amnestic across all three memory tasks, with retention percentages ranging from 0-29%. While he was able to answer recognition questions relating to a previously read story well, he performed very poorly when recognizing information from list and figure memory tasks. Taken together, this certainly suggests concerns for the presence of rapid forgetting and an evolving memory storage deficit. Both of which are hallmark characteristics of Alzheimer's disease.  Mr. Truxillo was accompanied by his wife during the current feedback session. Content of the current session focused on the results of his neuropsychological evaluation. Mr. Noah was given the opportunity to ask questions and his questions were answered. He was encouraged to reach out should additional questions arise. A copy of his report was provided at the conclusion of the visit.      40 minutes were spent conducting the current feedback session with Mr. Poythress, billed as one unit [phone].

## 2021-05-03 ENCOUNTER — Other Ambulatory Visit: Payer: Self-pay | Admitting: Physician Assistant

## 2021-05-03 ENCOUNTER — Telehealth: Payer: Self-pay | Admitting: Physician Assistant

## 2021-05-03 MED ORDER — RIVASTIGMINE TARTRATE 1.5 MG PO CAPS: ORAL_CAPSULE | ORAL | 11 refills | Status: DC

## 2021-05-03 NOTE — Telephone Encounter (Signed)
Spoke with Randy Morton advised him PLease hold Aricept for 1 week. After 1 week start  Rivastigmine 1.5 mg, start with one tab at night for 2 weeks, then increase to 1.5 mg one at night and one during the day. PLease stay hydrated. For leg cramps you can try a teaspoon of mustard when it happen

## 2021-05-03 NOTE — Telephone Encounter (Signed)
Patient is having issues with his aricept. He is having cramps in his legs, along with bad dreams. He would like to try another medication.

## 2021-05-04 DIAGNOSIS — H2511 Age-related nuclear cataract, right eye: Secondary | ICD-10-CM | POA: Diagnosis not present

## 2021-05-04 DIAGNOSIS — H2512 Age-related nuclear cataract, left eye: Secondary | ICD-10-CM | POA: Diagnosis not present

## 2021-05-05 ENCOUNTER — Ambulatory Visit: Payer: Medicare Other | Admitting: Physician Assistant

## 2021-05-11 DIAGNOSIS — H2511 Age-related nuclear cataract, right eye: Secondary | ICD-10-CM | POA: Diagnosis not present

## 2021-07-29 DIAGNOSIS — E7801 Familial hypercholesterolemia: Secondary | ICD-10-CM | POA: Diagnosis not present

## 2021-07-29 DIAGNOSIS — Z125 Encounter for screening for malignant neoplasm of prostate: Secondary | ICD-10-CM | POA: Diagnosis not present

## 2021-07-29 DIAGNOSIS — I10 Essential (primary) hypertension: Secondary | ICD-10-CM | POA: Diagnosis not present

## 2021-07-29 DIAGNOSIS — R5383 Other fatigue: Secondary | ICD-10-CM | POA: Diagnosis not present

## 2021-08-03 DIAGNOSIS — Z0000 Encounter for general adult medical examination without abnormal findings: Secondary | ICD-10-CM | POA: Diagnosis not present

## 2021-08-03 DIAGNOSIS — K219 Gastro-esophageal reflux disease without esophagitis: Secondary | ICD-10-CM | POA: Diagnosis not present

## 2021-08-03 DIAGNOSIS — I10 Essential (primary) hypertension: Secondary | ICD-10-CM | POA: Diagnosis not present

## 2021-08-03 DIAGNOSIS — J309 Allergic rhinitis, unspecified: Secondary | ICD-10-CM | POA: Diagnosis not present

## 2021-08-04 ENCOUNTER — Other Ambulatory Visit: Payer: Self-pay | Admitting: Internal Medicine

## 2021-08-04 DIAGNOSIS — F17210 Nicotine dependence, cigarettes, uncomplicated: Secondary | ICD-10-CM

## 2021-09-03 ENCOUNTER — Ambulatory Visit
Admission: RE | Admit: 2021-09-03 | Discharge: 2021-09-03 | Disposition: A | Discharge status: Home / Self Care | Payer: Medicare Other | Source: Ambulatory Visit | Attending: Internal Medicine | Admitting: Internal Medicine

## 2021-09-03 DIAGNOSIS — F17210 Nicotine dependence, cigarettes, uncomplicated: Secondary | ICD-10-CM

## 2021-09-06 ENCOUNTER — Encounter: Payer: Self-pay | Admitting: Student

## 2021-09-06 ENCOUNTER — Ambulatory Visit: Payer: Medicare Other | Admitting: Student

## 2021-09-06 ENCOUNTER — Ambulatory Visit: Payer: Medicare Other | Admitting: Cardiology

## 2021-09-06 VITALS — BP 117/76 | HR 71 | Temp 98.0°F | Resp 16 | Ht 69.0 in | Wt 196.0 lb

## 2021-09-06 DIAGNOSIS — I6523 Occlusion and stenosis of bilateral carotid arteries: Secondary | ICD-10-CM

## 2021-09-06 DIAGNOSIS — I10 Essential (primary) hypertension: Secondary | ICD-10-CM

## 2021-09-06 DIAGNOSIS — E782 Mixed hyperlipidemia: Secondary | ICD-10-CM | POA: Diagnosis not present

## 2021-09-06 DIAGNOSIS — I714 Abdominal aortic aneurysm, without rupture, unspecified: Secondary | ICD-10-CM

## 2021-09-06 NOTE — Progress Notes (Signed)
[name] Date of Birth: [birth year] MRN: [id number] Primary Care Provider:Pharr, Thayer Jew, MD Primary Cardiologist: Dr. Rex Kras   Date: 09/06/21 Chief Complaint  Patient presents with   AAA   Follow-up    2 years    HPI  LEJEND DALBY is a 76 y.o.  male evaluated today via a video visit with a chief complaint of "6 month followup carotid artery stenosis." Patient's past medical history and cardiovascular risk factors include: hypertension, hyperlipidemia, GERD, Barrett's esophagus, abdominal aortic aneurysm and atherosclerosis, coronary artery atherosclerosis noted on nongated CT.  Patient has previously been followed in our office for management of abdominal aortic aneurysm as well as carotid artery stenosis.  He was last seen 05/01/2020 by Dr. Terri Skains at which time patient was recommended aggressive risk factor modification and advised to continue aspirin and statin therapy.  He is now referred back to our office by PCP for follow-up as he was unfortunately lost to follow-up after last visit.  Patient unfortunately continues to smoke half pack per day and has no formal exercise routine.  He is asymptomatic with no chest pain or heart failure symptoms.  I personally reviewed external labs, lipids are well controlled with LDL at 49.  ALLERGIES: No Known Allergies   MEDICATION LIST PRIOR TO VISIT: Current Outpatient Medications on File Prior to Visit  Medication Sig Dispense Refill   aspirin 81 MG chewable tablet Chew 162 mg by mouth daily.     atorvastatin (LIPITOR) 40 MG tablet Take 80 mg by mouth daily.  2   pantoprazole (PROTONIX) 40 MG tablet Take 40 mg by mouth daily. Barrett's Esophagus  2   rivastigmine (EXELON) 1.5 MG capsule Take 1 cap (1.5 mg)  at night for 2 weeks, then increase to 1cap (1.5 mg) twice a day 60 capsule 11   valsartan (DIOVAN) 320 MG tablet Take 320 mg by mouth daily.     No current facility-administered medications on file prior to visit.    PAST MEDICAL HISTORY: Past Medical History:  Diagnosis Date   Abdominal aortic aneurysm (AAA) (HCC)    Known 1.5cm   Barrett esophagus    Carotid artery stenosis, symptomatic, left    COPD (chronic obstructive pulmonary disease)    Diverticular disease of colon    Diverticulosis of small intestine    Gastroesophageal reflux disease    History of cancer    Hypercholesteremia    Hypertension    Lacunar infarction 11/17/2020   right cerebellum; seen on brain MRI   Mild cognitive impairment with memory loss 04/23/2021   Personal history of colonic polyps    Tobacco use 01/29/2018    PAST SURGICAL HISTORY: Past Surgical History:  Procedure Laterality Date   ADENOIDECTOMY     CATARACT EXTRACTION Bilateral    COLONOSCOPY     PROSTATECTOMY     TONSILLECTOMY      FAMILY HISTORY: The patient's family history includes Alcohol abuse in his father; CVA in his mother; Dementia in his brother and mother; Lung cancer in his father.   SOCIAL HISTORY:  The patient  reports that he has been smoking cigarettes. He has a 27.50 pack-year smoking history. He has never used smokeless tobacco. He reports that he does not currently use alcohol. He reports that he does not use drugs.  Review of Systems  Constitutional: Negative for malaise/fatigue and weight gain.  Cardiovascular:  Negative for chest pain, claudication, dyspnea on exertion, leg swelling, near-syncope, orthopnea, palpitations, paroxysmal nocturnal dyspnea and syncope.  Neurological:  Negative for dizziness.   PHYSICAL EXAM: Physical Exam Vitals reviewed.  Cardiovascular:     Rate and Rhythm: Normal rate and regular rhythm.     Pulses: Intact distal pulses.     Heart sounds: S1 normal and S2 normal. No murmur heard.   No gallop.  Pulmonary:     Effort: Pulmonary effort is normal. No respiratory distress.     Breath sounds: No wheezing, rhonchi or rales.  Musculoskeletal:     Right lower leg: No edema.     Left lower leg:  No edema.  Neurological:     Mental Status: He is alert.   RADIOLOGY: CT scan of the chest: 06/23/2017:  Aortic atherosclerosis and calcified atherosclerotic plaque in LAD, RCA, and LCx .   CARDIAC DATABASE: EKG: 09/24/2019: Sinus  Rhythm, 66 bpm, incomplete right bundle branch block, without underlying ischemia or injury pattern.   09/06/2021: Sinus rhythm at a rate of 63 bpm.  Normal axis.  Incomplete right bundle branch block.  No evidence of ischemia or underlying injury pattern.  Echocardiogram: 09/20/2017:  Left ventricle cavity is normal in size. Moderate concentric hypertrophy of the left ventricle. Normal global wall motion. Normal diastolic filling pattern. Calculated EF 55%. Moderate (Grade II) mitral regurgitation. Mild tricuspid regurgitation.  No evidence of pulmonary hypertension.   Stress Testing:  Exercise Treadmill Stress Test: 10/02/2017: Indication: Coronary Artery Calcification The patient exercised on Bruce protocol for 6:24 min. Patient achieved 7.55 METS and reached HR 136 bpm, which is 92% of maximum age-predicted HR. Stress test terminated due to fatigue. Exercise capacity was low normal. HR Response to Exercise: Appropriate. BP Response to Exercise: Normal resting BP- appropriate response. Chest Pain: none. Arrhythmias: Rare PAC's ST Changes: With peak exercise there was no ST-T changes of ischemia.   Overall Impression: Normal stress test. Continue primary/secondary prevention.   Vascular imaging: Abdominal Aortic Duplex  09/13/2019:  Severe plaque noted in the distal aorta. Diffuse plaque noted in the  proximal and mid aorta. Clinical correlation is suggested.  An abdominal aortic aneurysm measuring 2.92 x 2.9 x 2.93 cm is seen.  Normal iliac artery velocity.  See attached images. No significant change from  09/20/2017. Recheck in 10  years if clinically indicated.   Carotid artery duplex  10/21/2019:  Stenosis in the right carotid artery   bifurcation (<50%).  Stenosis in the left internal carotid artery (16-49%). Stenosis in the left carotid bifurcation (<50%)  Antegrade right vertebral artery flow. Antegrade left vertebral artery flow.  Follow up in one year is appropriate if clinically indicated  LABORATORY DATA:    Latest Ref Rng & Units 05/13/2018    4:21 PM 05/20/2017    3:53 PM 03/25/2008    4:30 AM  CBC  WBC 4.0 - 10.5 K/uL 6.4   5.7     Hemoglobin 13.0 - 17.0 g/dL 13.6   15.3   12.4    Hematocrit 39.0 - 52.0 % 41.6   43.5   36.6    Platelets 150 - 400 K/uL 158   105          Latest Ref Rng & Units 05/13/2018    6:43 PM 05/20/2017    3:53 PM 03/20/2008    9:40 AM  CMP  Glucose 70 - 99 mg/dL 97   92   112    BUN 8 - 23 mg/dL '20   19   15    '$ Creatinine 0.61 - 1.24 mg/dL 1.55   1.33  1.06    Sodium 135 - 145 mmol/L 136   134   138    Potassium 3.5 - 5.1 mmol/L 3.7   4.0   4.1    Chloride 98 - 111 mmol/L 104   99   103    CO2 22 - 32 mmol/L '23   23   29    '$ Calcium 8.9 - 10.3 mg/dL 8.4   9.0   9.4    Total Protein 6.5 - 8.1 g/dL 6.1      Total Bilirubin 0.3 - 1.2 mg/dL 0.9      Alkaline Phos 38 - 126 U/L 51      AST 15 - 41 U/L 30      ALT 0 - 44 U/L 32        Lipid Panel  No results found for: CHOL, TRIG, HDL, CHOLHDL, VLDL, LDLCALC, LDLDIRECT, LABVLDL  No results found for: HGBA1C No components found for: NTPROBNP No results found for: TSH  Cardiac Panel (last 3 results) No results for input(s): CKTOTAL, CKMB, TROPONINIHS, RELINDX in the last 72 hours.  External labs: 07/29/2021: Sodium 143, potassium 4.6, BUN 23, creatinine 1.36, GFR 50 Total cholesterol 129, triglycerides 178, HDL 44, LDL 49  IMPRESSION:    ICD-10-CM   1. Abdominal aortic aneurysm (AAA) without rupture, unspecified part ([place])  I71.40 EKG 12-Lead    2. Asymptomatic bilateral carotid artery stenosis  I65.23 PCV CAROTID DUPLEX (BILATERAL)    3. Benign hypertension  I10     4. Mixed hyperlipidemia  E78.2         RECOMMENDATIONS: REISE GLADNEY is a 76 y.o. male whose past medical history and cardiovascular risk factors include: hypertension, hyperlipidemia, GERD, Barrett's esophagus, abdominal aortic aneurysm and atherosclerosis, coronary artery atherosclerosis noted on nongated CT.  Bilateral carotid artery atherosclerosis:   Continue aspirin and statin therapy We will obtain repeat carotid artery surveillance as patient is overdue for this. Lipids are well controlled, I personally reviewed external records.  Abdominal aortic aneurysm, without rupture and asymptomatic: Stable measuring  2.92 x 2.9 x 2.93 cm. Recheck in 2031 Reiterated the importance of complete smoking cessation, however patient does not appear motivated to quit smoking at this time. Blood pressure is well controlled Continue aspirin and statin therapy Continue valsartan   Benign essential hypertension: Management per PCP, recommend systolic blood pressure <932 mmHg.   Mixed hyperlipidemia: Continue statin therapy.  Will defer further management to PCP.    FINAL MEDICATION LIST END OF ENCOUNTER: No orders of the defined types were placed in this encounter.   Medications Discontinued During This Encounter  Medication Reason   telmisartan (MICARDIS) 80 MG tablet Patient has not taken in last 30 days   valsartan-hydrochlorothiazide (DIOVAN-HCT) 320-12.5 MG tablet Patient has not taken in last 30 days     Current Outpatient Medications:    aspirin 81 MG chewable tablet, Chew 162 mg by mouth daily., Disp: , Rfl:    atorvastatin (LIPITOR) 40 MG tablet, Take 80 mg by mouth daily., Disp: , Rfl: 2   pantoprazole (PROTONIX) 40 MG tablet, Take 40 mg by mouth daily. Barrett's Esophagus, Disp: , Rfl: 2   rivastigmine (EXELON) 1.5 MG capsule, Take 1 cap (1.5 mg)  at night for 2 weeks, then increase to 1cap (1.5 mg) twice a day, Disp: 60 capsule, Rfl: 11   valsartan (DIOVAN) 320 MG tablet, Take 320 mg by mouth daily., Disp: , Rfl:      Jiovany Scheffel C Mady Oubre, PA-C  09/06/2021, 2:56 PM Office: [phone]

## 2021-09-16 ENCOUNTER — Ambulatory Visit: Payer: Medicare Other

## 2021-09-16 DIAGNOSIS — I6523 Occlusion and stenosis of bilateral carotid arteries: Secondary | ICD-10-CM

## 2021-12-18 IMAGING — MR MR HEAD W/O CM
10 series · 48 of 48 positions shown · non-contrast
Comparison: CT head without contrast 05/13/2018

CLINICAL DATA: Occasional short-term memory loss.

EXAM:
MRI HEAD WITHOUT CONTRAST
TECHNIQUE: Multiplanar, multiecho pulse sequences of the brain and surrounding
structures were obtained without intravenous contrast.

[Series 2: T1 · sagittal · 5.0mm · 0.45mm/px · 3 of 23 slices shown]
[im 1/23]
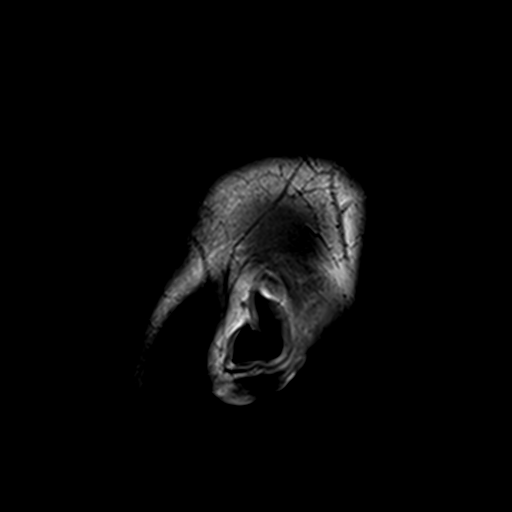
[im 12/23]
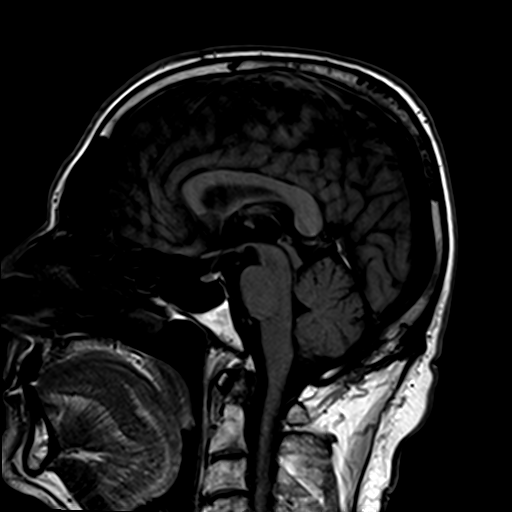
[im 23/23]
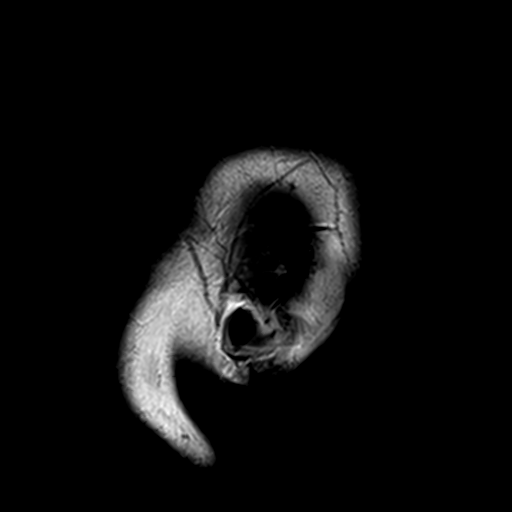

[Series 3: DWI · axial · 3.0mm · 1.80mm/px · z∈[-19,+123]mm · 9 of 100 slices shown (1 of 4)]
[im 1/100]
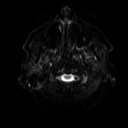
[im 13/100]
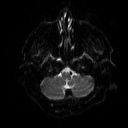
[im 25/100]
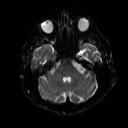
[im 38/100]
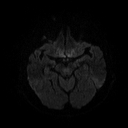
[im 50/100]
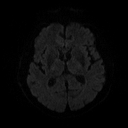
[im 62/100]
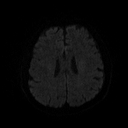
[im 75/100]
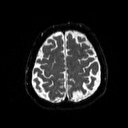
[im 87/100]
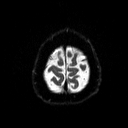
[im 100/100]
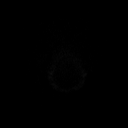

[Series 4: DWI · axial · 3.0mm · 1.80mm/px · z∈[-19,+123]mm · 4 of 47 slices shown (2 of 4)]
[im 1/47]
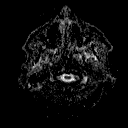
[im 16/47]
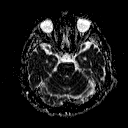
[im 31/47]
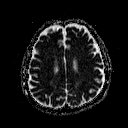
[im 47/47]
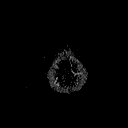

[Series 5: DWI · coronal · 5.0mm · 1.80mm/px · 6 of 67 slices shown (3 of 4)]
[im 1/67]
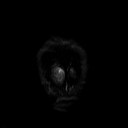
[im 14/67]
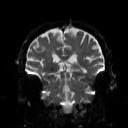
[im 27/67]
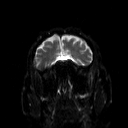
[im 40/67]
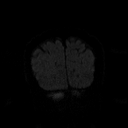
[im 53/67]
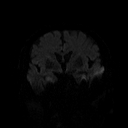
[im 67/67]
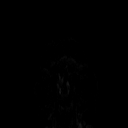

[Series 6: DWI · coronal · 5.0mm · 1.80mm/px · 3 of 34 slices shown (4 of 4)]
[im 1/34]
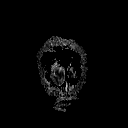
[im 17/34]
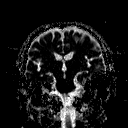
[im 34/34]
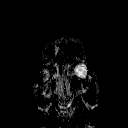

[Series 7: T2 · axial · 5.0mm · 0.51mm/px · z∈[-18,+124]mm · 2 of 22 slices shown (1 of 2)]
[im 1/22]
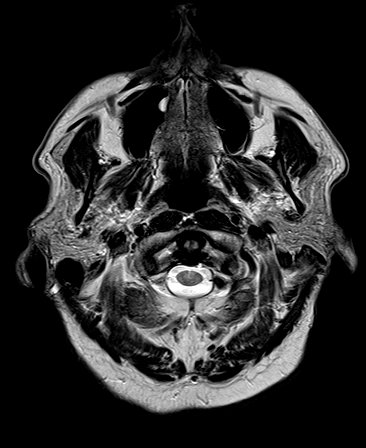
[im 22/22]
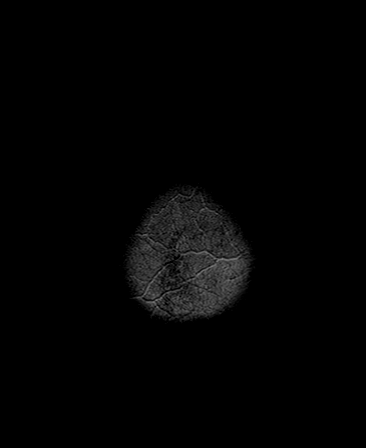

[Series 8: FLAIR · axial · 3.0mm · 0.45mm/px · z∈[-20,+124]mm · 3 of 33 slices shown]
[im 1/33]
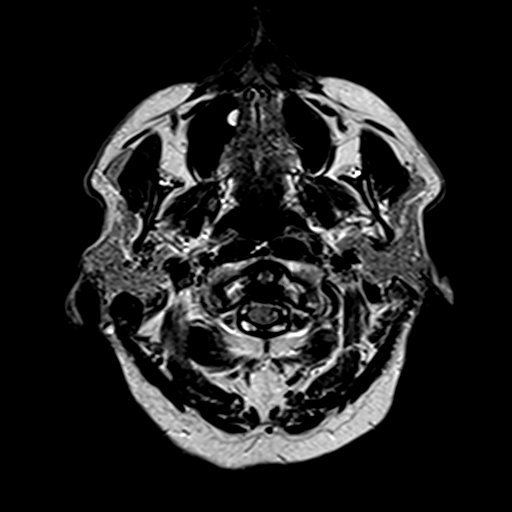
[im 17/33]
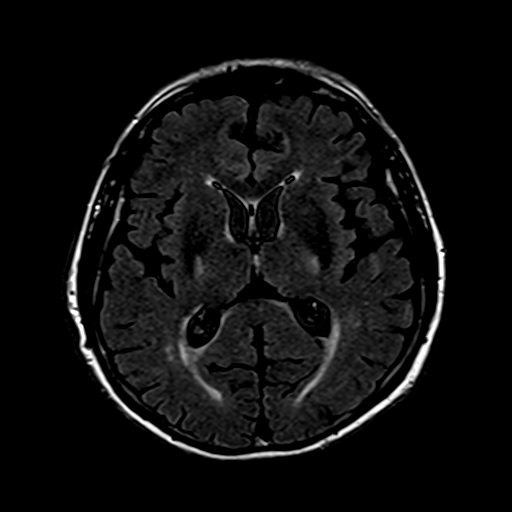
[im 33/33]
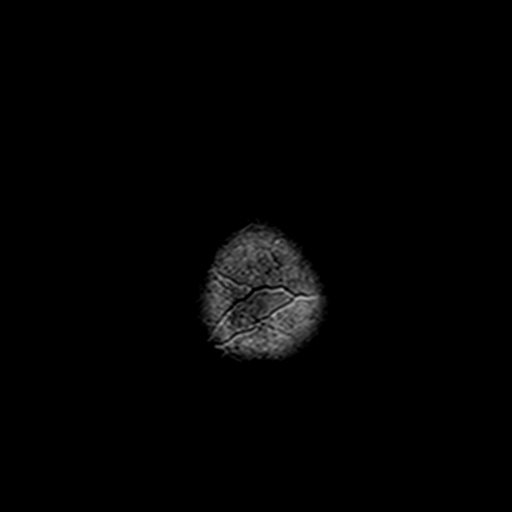

[Series 10: swi_images · axial · 4.0mm · 0.90mm/px · z∈[-16,+120]mm · 3 of 36 slices shown]
[im 1/36]
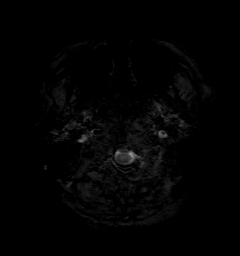
[im 18/36]
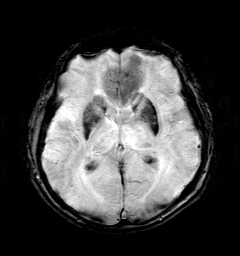
[im 36/36]
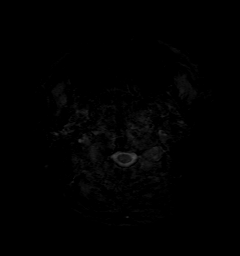

[Series 11: t1_mpr_tra · axial · 1.0mm · 0.71mm/px · z∈[-17,+122]mm · 13 of 144 slices shown]
[im 1/144]
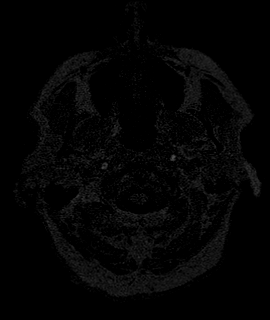
[im 12/144]
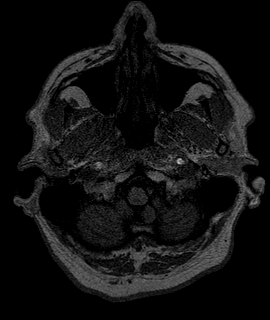
[im 24/144]
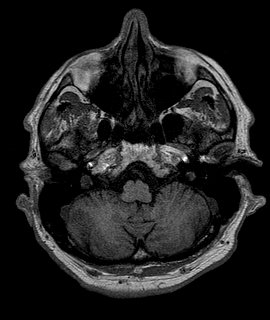
[im 36/144]
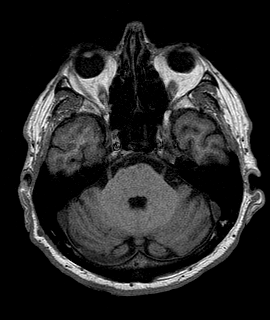
[im 48/144]
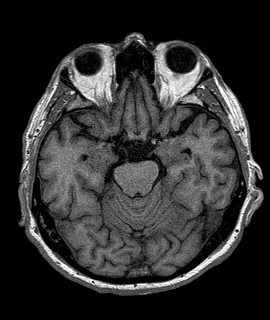
[im 60/144]
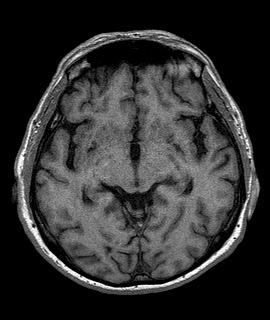
[im 72/144]
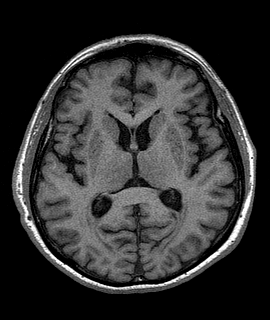
[im 84/144]
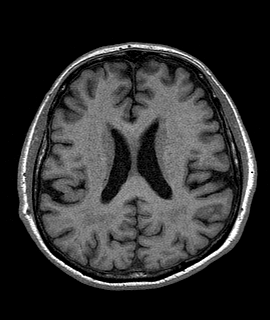
[im 96/144]
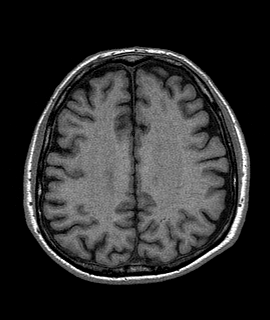
[im 108/144]
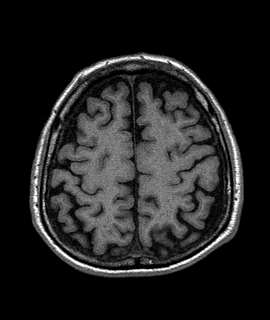
[im 120/144]
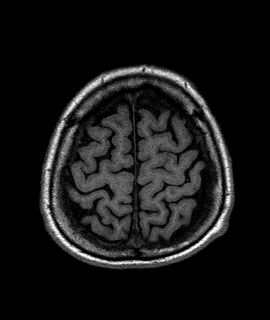
[im 132/144]
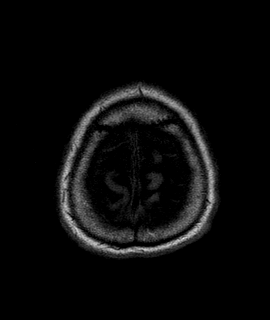
[im 144/144]
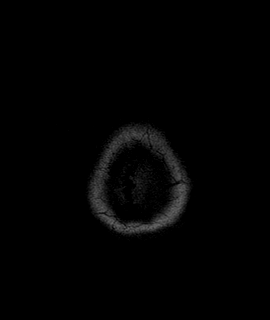

[Series 12: T2 · coronal · 5.0mm · 0.45mm/px · 2 of 27 slices shown (2 of 2)]
[im 1/27]
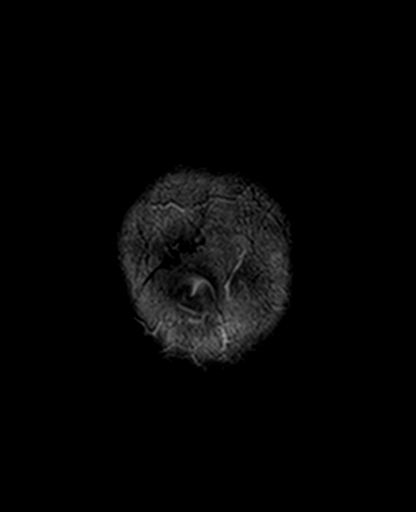
[im 27/27]
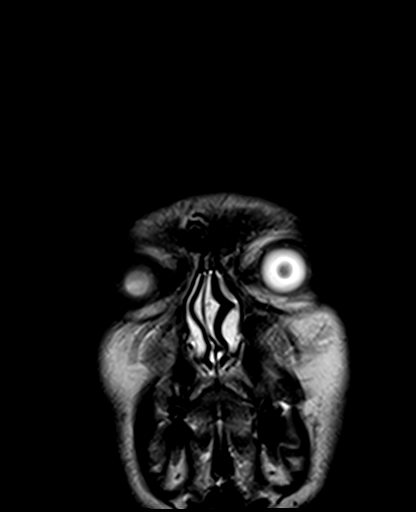

[48 of 48 positions shown; findings below may reference images not displayed]

FINDINGS: Brain: Periventricular and subcortical T2 changes are mildly
advanced for age. Mild generalized atrophy is present. The
ventricles are proportionate to the degree of atrophy. No
significant extra-axial fluid collection is present. A remote
lacunar infarct is present in the right cerebellum. Brainstem and
cerebellum are otherwise within normal limits.

Vascular: Insert normal flow. The left vertebral artery is dominant.

Skull and upper cervical spine: Degenerative changes are present in
the upper cervical spine, most notably at C3-4. Marrow signal and
vertebral body heights are normal.

Sinuses/Orbits: The paranasal sinuses and mastoid air cells are
clear. The globes and orbits are within normal limits.
IMPRESSION: 1. No acute intracranial abnormality.
2. Mild generalized atrophy and white matter disease is mildly
advanced for age. This likely reflects the sequela of chronic
microvascular ischemia.
3. Remote lacunar infarct of the right cerebellum.

## 2021-12-28 ENCOUNTER — Telehealth: Payer: Self-pay | Admitting: Physician Assistant

## 2021-12-28 NOTE — Telephone Encounter (Signed)
Will try and call tomorrow, missed time frame in clinic

## 2021-12-28 NOTE — Telephone Encounter (Signed)
Pt's wife called, she needs to speak with sara about [place]. She said she needs to speak with her in private. She is not able to speak in front of him cause it will anger him. It is about his worsening dementia. Please call her after 2-3pm, as he wont be around her then.[phone]

## 2021-12-29 NOTE — Telephone Encounter (Signed)
Would like to talk to [facility], regarding some questions. Can you reach out to her, an advise on recommendations. Thank you

## 2022-01-26 NOTE — Telephone Encounter (Signed)
Patient wife called and left a VM about having a Education officer, museum call her several weeks ago and has  not heard form anyone, please call patient wife on this [phone] only per patient wife

## 2022-01-26 NOTE — Telephone Encounter (Signed)
Called Pt and talked to wife. Told her that Misty called her 3 weeks ago and she never returned the call and now she has left the practice. She said that she did not know how to use the voice mail; I informed her of [facility] and she did not want to do it because they would not be up on the case. Said she really needs to talk to someone alone. I told her the only thing I can do is keep this message open and when the new Social worker arrivals and let them call. She understood

## 2022-02-17 DIAGNOSIS — D235 Other benign neoplasm of skin of trunk: Secondary | ICD-10-CM | POA: Diagnosis not present

## 2022-02-17 DIAGNOSIS — L821 Other seborrheic keratosis: Secondary | ICD-10-CM | POA: Diagnosis not present

## 2022-02-17 DIAGNOSIS — Z85828 Personal history of other malignant neoplasm of skin: Secondary | ICD-10-CM | POA: Diagnosis not present

## 2022-02-17 DIAGNOSIS — Z85820 Personal history of malignant melanoma of skin: Secondary | ICD-10-CM | POA: Diagnosis not present

## 2022-03-04 DIAGNOSIS — M25512 Pain in left shoulder: Secondary | ICD-10-CM | POA: Diagnosis not present

## 2022-04-28 DIAGNOSIS — H5211 Myopia, right eye: Secondary | ICD-10-CM | POA: Diagnosis not present

## 2022-05-20 ENCOUNTER — Other Ambulatory Visit: Payer: Self-pay | Admitting: Physician Assistant

## 2022-06-22 ENCOUNTER — Ambulatory Visit: Payer: Medicare Other | Admitting: Podiatry

## 2022-06-22 ENCOUNTER — Ambulatory Visit (INDEPENDENT_AMBULATORY_CARE_PROVIDER_SITE_OTHER): Payer: Medicare Other

## 2022-06-22 ENCOUNTER — Encounter: Payer: Self-pay | Admitting: Podiatry

## 2022-06-22 DIAGNOSIS — M7752 Other enthesopathy of left foot: Secondary | ICD-10-CM

## 2022-06-22 DIAGNOSIS — Q828 Other specified congenital malformations of skin: Secondary | ICD-10-CM | POA: Diagnosis not present

## 2022-06-22 DIAGNOSIS — S99922A Unspecified injury of left foot, initial encounter: Secondary | ICD-10-CM | POA: Diagnosis not present

## 2022-06-22 MED ORDER — TRIAMCINOLONE ACETONIDE 10 MG/ML IJ SUSP
10.0000 mg | Freq: Once | INTRAMUSCULAR | Status: AC
  Administered 2022-06-22: 10 mg

## 2022-06-22 NOTE — Progress Notes (Signed)
Subjective:   Patient ID: Randy Morton, male   DOB: 77 y.o.   MRN: [id number]   HPI Patient presents stating that he injured his left foot around 3 months ago and it has been very sore on the outside and he is getting ready to go to [place] in 2 weeks.  Patient states that does not remember any other pathology there was no break in skin from the injury.  Patient smokes half pack per day tries to be active   Review of Systems  All other systems reviewed and are negative.       Objective:  Physical Exam Vitals and nursing note reviewed.  Constitutional:      Appearance: He is well-developed.  Pulmonary:     Effort: Pulmonary effort is normal.  Musculoskeletal:        General: Normal range of motion.  Skin:    General: Skin is warm.  Neurological:     Mental Status: He is alert.     Neurovascular status intact muscle strength found to be adequate range of motion adequate with exquisite discomfort lateral side left foot plantar with inflammation fluid around the head of the fifth metatarsal.  There is keratotic tissue formation also present no other pathology noted good digital perfusion     Assessment:  Appears to be inflammatory capsulitis fifth MPJ with history of trauma cannot rule out other pathology with lesion formation also present     Plan:  H&P reviewed all conditions sterile prep and injected the fifth MPJ plantar 3 mg Dexasone Kenalog 5 mg Xylocaine and then debrided the lesion applied cushioning discussed future treatments if symptoms were to persist  X-rays were negative for signs of bony injury or other bone pathology from the injury 3 months ago

## 2022-06-27 ENCOUNTER — Other Ambulatory Visit: Payer: Self-pay | Admitting: Podiatry

## 2022-06-27 DIAGNOSIS — Q828 Other specified congenital malformations of skin: Secondary | ICD-10-CM

## 2022-06-27 DIAGNOSIS — S99922A Unspecified injury of left foot, initial encounter: Secondary | ICD-10-CM

## 2022-06-27 DIAGNOSIS — M7752 Other enthesopathy of left foot: Secondary | ICD-10-CM

## 2022-09-07 ENCOUNTER — Encounter: Payer: Self-pay | Admitting: Cardiology

## 2022-09-07 ENCOUNTER — Ambulatory Visit: Payer: Medicare Other | Admitting: Cardiology

## 2022-09-07 ENCOUNTER — Ambulatory Visit: Payer: Medicare Other | Admitting: Student

## 2022-09-07 VITALS — BP 104/68 | HR 72 | Ht 69.0 in | Wt 173.0 lb

## 2022-09-07 DIAGNOSIS — I714 Abdominal aortic aneurysm, without rupture, unspecified: Secondary | ICD-10-CM | POA: Diagnosis not present

## 2022-09-07 DIAGNOSIS — E7800 Pure hypercholesterolemia, unspecified: Secondary | ICD-10-CM

## 2022-09-07 DIAGNOSIS — I6523 Occlusion and stenosis of bilateral carotid arteries: Secondary | ICD-10-CM | POA: Diagnosis not present

## 2022-09-07 DIAGNOSIS — I10 Essential (primary) hypertension: Secondary | ICD-10-CM | POA: Diagnosis not present

## 2022-09-07 NOTE — Progress Notes (Signed)
Primary Physician/Referring:  Merri Brunette, MD  Patient ID: Randy Morton, male    DOB: 20-Dec-1945, 77 y.o.   MRN: [id number]  Chief Complaint  Patient presents with  . Abdominal aortic aneurysm (AAA) without rupture, unspecifie  . Follow-up   HPI:    Randy Morton  is a 77 y.o. Caucasian male patient with ongoing tobacco use disorder, hypertension, hyperlipidemia, GERD, Barrett's esophagus, small abdominal attic aneurysm, coronary calcification noted on CT scan of the chest presents here for 6 annual visit.  Except for weight loss, unintentional, memory loss and new diagnosis of dementia, denies symptoms remains asymptomatic.  Past Medical History:  Diagnosis Date  . Abdominal aortic aneurysm (AAA) (HCC)    Known 1.5cm  . Barrett esophagus   . Carotid artery stenosis, symptomatic, left   . COPD (chronic obstructive pulmonary disease)   . Diverticular disease of colon   . Diverticulosis of small intestine   . Gastroesophageal reflux disease   . History of cancer   . Hypercholesteremia   . Hypertension   . Lacunar infarction 11/17/2020   right cerebellum; seen on brain MRI  . Mild cognitive impairment with memory loss 04/23/2021  . Personal history of colonic polyps   . Tobacco use 01/29/2018   Past Surgical History:  Procedure Laterality Date  . ADENOIDECTOMY    . CATARACT EXTRACTION Bilateral   . COLONOSCOPY    . PROSTATECTOMY    . TONSILLECTOMY     Family History  Problem Relation Age of Onset  . CVA Mother   . Dementia Mother        vascular dementia  . Lung cancer Father   . Alcohol abuse Father   . Dementia Brother        Lewy body dementia; onset in 77s    Social History   Tobacco Use  . Smoking status: Every Day    Packs/day: 0.50    Years: 55.00    Additional pack years: 0.00    Total pack years: 27.50    Types: Cigarettes  . Smokeless tobacco: Never  . Tobacco comments:    Pt. is contemplating quitting  Substance Use Topics  . Alcohol  use: Not Currently    Comment: very rare   Marital Status: Married  ROS  Review of Systems  Cardiovascular:  Negative for chest pain, dyspnea on exertion and leg swelling.  Objective      09/07/2022    1:33 PM 09/06/2021   11:12 AM 11/24/2020    2:24 PM  Vitals with BMI  Height 5\' 9"  5\' 9"  5\' 9"   Weight 173 lbs 196 lbs 191 lbs  BMI 25.54 28.93 28.19  Systolic 104 117 409  Diastolic 68 76 77  Pulse 72 71 74   Blood pressure 104/68, pulse 72, height 5\' 9"  (1.753 m), weight 173 lb (78.5 kg), SpO2 98 %.  Physical Exam Neck:     Vascular: No carotid bruit or JVD.  Cardiovascular:     Rate and Rhythm: Normal rate and regular rhythm.     Pulses: Intact distal pulses.     Heart sounds: Normal heart sounds. No murmur heard.    No gallop.  Pulmonary:     Effort: Pulmonary effort is normal.     Breath sounds: Normal breath sounds.  Abdominal:     General: Bowel sounds are normal.     Palpations: Abdomen is soft.  Musculoskeletal:     Right lower leg: No edema.  Left lower leg: No edema.   Laboratory examination:   External labs:   Cholesterol, total 129.000 m 07/29/2021 HDL 44.000 mg 07/29/2021 LDL 53.000 MG 07/27/2020 Triglycerides 178.000 m 07/29/2021  Creatinine, Serum 1.490 MG/ 07/27/2020 ALT (SGPT) 25.000 IU/ 07/27/2020  TSH 3.400 07/29/2021  Radiology:   CT scan of the chest 06/23/2017:  Aortic atherosclerosis and calcified atherosclerotic plaque in LAD, RCA, and LCx .  Cardiac Studies:   Echocardiogram 09/20/2017:  Left ventricle cavity is normal in size. Moderate concentric hypertrophy of the left ventricle. Normal global wall motion. Normal diastolic filling pattern. Calculated EF 55%. Moderate (Grade II) mitral regurgitation. Mild tricuspid regurgitation.  No evidence of pulmonary hypertension.  Exercise Treadmill Stress Test 10/02/2017: Indication: Coronary Artery Calcification The patient exercised on Bruce protocol for 6:24 min. Patient achieved 7.55 METS  and reached HR 136 bpm, which is 92% of maximum age-predicted HR. Stress test terminated due to fatigue. Exercise capacity was low normal. HR Response to Exercise: Appropriate. BP Response to Exercise: Normal resting BP- appropriate response. Chest Pain: none. Arrhythmias: Rare PAC's ST Changes: With peak exercise there was no ST-T changes of ischemia.   Overall Impression: Normal stress test. Continue primary/secondary prevention.  Abdominal Aortic Duplex  09/13/2019:  Severe plaque noted in the distal aorta. Diffuse plaque noted in the  proximal and mid aorta. Clinical correlation is suggested.  An abdominal aortic aneurysm measuring 2.92 x 2.9 x 2.93 cm is seen.  Normal iliac artery velocity.  See attached images. No significant change from  09/20/2017. Recheck in 10 years if clinically indicated.   Carotid artery duplex 09/16/2021: Duplex suggests stenosis in the right internal carotid artery (minimal). Duplex suggests stenosis in the left internal carotid artery (50-69%). Antegrade right vertebral artery flow. Antegrade left vertebral artery flow. No significant change from 10/21/2019. Follow up in six months is appropriate if clinically indicated.  EKG:   EKG 09/07/2022: Normal sinus rhythm at the rate of 65 bpm, normal axis, incomplete right bundle branch block otherwise normal EKG.      Medications and allergies  No Known Allergies   Medication list   Current Outpatient Medications:  .  aspirin 81 MG chewable tablet, Chew 162 mg by mouth daily., Disp: , Rfl:  .  rivastigmine (EXELON) 1.5 MG capsule, Take 1 cap (1.5 mg)  at night for 2 weeks, then increase to 1cap (1.5 mg) twice a day, Disp: 60 capsule, Rfl: 11 .  VIAGRA 50 MG tablet, Take 50 mg by mouth as needed., Disp: , Rfl:  .  atorvastatin (LIPITOR) 80 MG tablet, Take 40 mg by mouth daily., Disp: , Rfl:  .  valsartan-hydrochlorothiazide (DIOVAN-HCT) 320-12.5 MG tablet, Take 1 tablet by mouth daily. (Patient not taking:  Reported on 09/07/2022), Disp: , Rfl:   Assessment     ICD-10-CM   1. Abdominal aortic aneurysm (AAA) without rupture, unspecified part (HCC)  I71.40 EKG 12-Lead    2. Asymptomatic bilateral carotid artery stenosis  I65.23     3. Primary hypertension  I10     4. Pure hypercholesterolemia  E78.00        Orders Placed This Encounter  Procedures  . EKG 12-Lead    No orders of the defined types were placed in this encounter.   Medications Discontinued During This Encounter  Medication Reason  . atorvastatin (LIPITOR) 40 MG tablet   . pantoprazole (PROTONIX) 40 MG tablet   . valsartan (DIOVAN) 320 MG tablet      Recommendations:   Randy Noa  AOI Morton is a 77 y.o.  Caucasian male patient with ongoing tobacco use disorder, hypertension, hyperlipidemia, GERD, Barrett's esophagus, small abdominal attic aneurysm, coronary calcification noted on CT scan of the chest presents here for 6 annual visit.  1. Abdominal aortic aneurysm (AAA) without rupture, unspecified part [facility]) Patient has a very small abdominal aortic aneurysm, could consider recheck in 5 years.  I have discussed with the patient and his wife regarding risk of progression especially with ongoing tobacco use disorder.  Smoking cessation discussed. - EKG 12-Lead  2. Asymptomatic bilateral carotid artery stenosis Very mild stenosis of the right and moderate stenosis on the left, will continue 6 monthly surveillance.  Lipids reviewed, lipids are well-controlled and he is presently tolerating statins.  3. Primary hypertension Blood pressure under excellent control, presently on valsartan HCT, continue the same.  4. Pure hypercholesterolemia I reviewed his external labs, lipids in excellent control.  Presently on atorvastatin 40 mg daily, continue the same.  Patient having constipation, discussed regarding fiber use, Metamucil on a daily basis.  Patient also having memory issues, discussed regarding memory games, cards, sudoku  and word puzzles.  Otherwise stable from cardiac standpoint, I will see him back in 6 months if he remains stable on annual basis.  His weight loss is basically related to decreased intake, do not suspect mesenteric ischemia.  I have discussed intake of high-calorie, high-quality diet  Other orders - valsartan-hydrochlorothiazide (DIOVAN-HCT) 320-12.5 MG tablet; Take 1 tablet by mouth daily. (Patient not taking: Reported on 09/07/2022) - atorvastatin (LIPITOR) 80 MG tablet; Take 40 mg by mouth daily. - VIAGRA 50 MG tablet; Take 50 mg by mouth as needed.  Other orders - valsartan-hydrochlorothiazide (DIOVAN-HCT) 320-12.5 MG tablet; Take 1 tablet by mouth daily. (Patient not taking: Reported on 09/07/2022) - atorvastatin (LIPITOR) 80 MG tablet; Take 40 mg by mouth daily. - VIAGRA 50 MG tablet; Take 50 mg by mouth as needed.    Yates Decamp, MD, [facility] 09/07/2022, 10:03 PM Office: [phone]

## 2022-09-15 DIAGNOSIS — I10 Essential (primary) hypertension: Secondary | ICD-10-CM | POA: Diagnosis not present

## 2022-09-15 DIAGNOSIS — Z125 Encounter for screening for malignant neoplasm of prostate: Secondary | ICD-10-CM | POA: Diagnosis not present

## 2022-09-22 DIAGNOSIS — I10 Essential (primary) hypertension: Secondary | ICD-10-CM | POA: Diagnosis not present

## 2022-09-22 DIAGNOSIS — Z0000 Encounter for general adult medical examination without abnormal findings: Secondary | ICD-10-CM | POA: Diagnosis not present

## 2022-09-22 DIAGNOSIS — K219 Gastro-esophageal reflux disease without esophagitis: Secondary | ICD-10-CM | POA: Diagnosis not present

## 2022-09-22 DIAGNOSIS — Z23 Encounter for immunization: Secondary | ICD-10-CM | POA: Diagnosis not present

## 2022-09-22 DIAGNOSIS — J309 Allergic rhinitis, unspecified: Secondary | ICD-10-CM | POA: Diagnosis not present

## 2022-09-22 DIAGNOSIS — N529 Male erectile dysfunction, unspecified: Secondary | ICD-10-CM | POA: Diagnosis not present

## 2022-09-26 ENCOUNTER — Other Ambulatory Visit: Payer: Self-pay | Admitting: Internal Medicine

## 2022-09-26 DIAGNOSIS — F17200 Nicotine dependence, unspecified, uncomplicated: Secondary | ICD-10-CM

## 2022-10-05 ENCOUNTER — Encounter: Payer: Self-pay | Admitting: Cardiology

## 2022-10-06 NOTE — Progress Notes (Signed)
Labs 09/15/2022:  Hb 13.5/HCT 41.0, platelets 230.  Serum glucose 89 mg, BUN 21, creatinine 1.55, EGFR 46,, potassium 4.6, LFTs normal.  Total cholesterol 131, triglycerides 106, HDL 49, LDL 63.

## 2022-11-29 DIAGNOSIS — F0390 Unspecified dementia without behavioral disturbance: Secondary | ICD-10-CM | POA: Diagnosis not present

## 2022-11-29 DIAGNOSIS — R630 Anorexia: Secondary | ICD-10-CM | POA: Diagnosis not present

## 2022-11-29 DIAGNOSIS — I10 Essential (primary) hypertension: Secondary | ICD-10-CM | POA: Diagnosis not present

## 2023-01-11 DIAGNOSIS — I10 Essential (primary) hypertension: Secondary | ICD-10-CM | POA: Diagnosis not present

## 2023-01-11 DIAGNOSIS — F0390 Unspecified dementia without behavioral disturbance: Secondary | ICD-10-CM | POA: Diagnosis not present

## 2023-01-11 DIAGNOSIS — R630 Anorexia: Secondary | ICD-10-CM | POA: Diagnosis not present

## 2023-01-11 DIAGNOSIS — Z23 Encounter for immunization: Secondary | ICD-10-CM | POA: Diagnosis not present

## 2023-03-08 ENCOUNTER — Other Ambulatory Visit: Payer: Self-pay | Admitting: Psychiatry

## 2023-03-08 ENCOUNTER — Ambulatory Visit (INDEPENDENT_AMBULATORY_CARE_PROVIDER_SITE_OTHER): Payer: Medicare Other | Admitting: Psychiatry

## 2023-03-08 ENCOUNTER — Encounter: Payer: Self-pay | Admitting: Psychiatry

## 2023-03-08 VITALS — BP 141/86 | HR 84 | Ht 68.0 in | Wt 175.0 lb

## 2023-03-08 DIAGNOSIS — G301 Alzheimer's disease with late onset: Secondary | ICD-10-CM | POA: Diagnosis not present

## 2023-03-08 DIAGNOSIS — F02B0 Dementia in other diseases classified elsewhere, moderate, without behavioral disturbance, psychotic disturbance, mood disturbance, and anxiety: Secondary | ICD-10-CM

## 2023-03-08 MED ORDER — LITHIUM CARBONATE 150 MG PO CAPS: 150.0000 mg | ORAL_CAPSULE | Freq: Every day | ORAL | 1 refills | Status: DC

## 2023-03-08 NOTE — Progress Notes (Signed)
[facility] MD/PA/NP Initial Note  03/13/2023 12:37 PM Randy Morton  MRN:  [id number]  Chief Complaint:  Chief Complaint   Establish Care; Memory Loss   Alzheimer's  .  Seen with wife.  HPI: feels pretty normal other than the memory problem.   Fluctuating BP med.   No SE current med memantine.   Jan 2023 neuropsych testing suggestive of Alz.   Didn't like his speed of testing and didn't want to go back. PCP [facility].  Pending [facility] in March. M dx vasc dementia and stroke.  B  died  2015-03-27 [name] body dementia.   Per wife  Retired July 1.  Sx memory worse after stopping. Per wife lost interest in golf and fishing.  Per wife "I can't live like this."  Wife looking for asst living facilities.  Likely [facility].  He doesn't want to move.  Son, Randy Morton belongs to [employer].  Gerontologist spoke there.   Married 55 years.   W admits not most patient person and gets frustrated with repeated questioning.   He can dress, bathe, hygiene.  More loss of logic, more frustrated and angry with her.   One of her goals is for him find avocation.  She is also wondering how this to progress.   Wife concerned about his poor appetite.  History wt loss with rivastigmine.   No hallucinations.   No fear.   No psychotic sx.   Smokes.   Per wife oversleeping.  Naps in afternoon with wife.  He has irregular pattern sleep.    Visit Diagnosis:    ICD-10-CM   1. Moderate late onset Alzheimer's dementia, unspecified whether behavioral, psychotic, or mood disturbance or anxiety (HCC)  G30.1    F02.B0       Past Psychiatric History:  Aricept NM Rivastigmine wt loss  Past Medical History:  Past Medical History:  Diagnosis Date   Abdominal aortic aneurysm (AAA) (HCC)    Known 1.5cm   Barrett esophagus    Carotid artery stenosis, symptomatic, left    COPD (chronic obstructive pulmonary disease)    Diverticular disease of colon    Diverticulosis of small intestine    Gastroesophageal reflux disease     History of cancer    Hypercholesteremia    Hypertension    Lacunar infarction 11/17/2020   right cerebellum; seen on brain MRI   Mild cognitive impairment with memory loss 04/23/2021   Personal history of colonic polyps    Tobacco use 01/29/2018    Past Surgical History:  Procedure Laterality Date   ADENOIDECTOMY     CATARACT EXTRACTION Bilateral    COLONOSCOPY     PROSTATECTOMY     TONSILLECTOMY      Family Psychiatric History: son Randy Morton.  D estranged.   B [name] Body dementia  Family History:  Family History  Problem Relation Age of Onset   CVA Mother    Dementia Mother        vascular dementia   Lung cancer Father    Alcohol abuse Father    Dementia Brother        Lewy body dementia; onset in 89s    Social History:  Social History   Socioeconomic History   Marital status: Married    Spouse name: Not on file   Number of children: 2   Years of education: 16   Highest education level: Bachelor's degree (e.g., BA, AB, BS)  Occupational History   Not on file  Tobacco Use  Smoking status: Every Day    Current packs/day: 0.50    Average packs/day: 0.5 packs/day for 55.0 years (27.5 ttl pk-yrs)    Types: Cigarettes   Smokeless tobacco: Never   Tobacco comments:    Pt. is contemplating quitting  Vaping Use   Vaping status: Never Used  Substance and Sexual Activity   Alcohol use: Not Currently    Comment: very rare   Drug use: No   Sexual activity: Not on file  Other Topics Concern   Not on file  Social History Narrative   Right handed   Drinks caffeine   3 story home   Social Determinants of Health   Financial Resource Strain: Not on file  Food Insecurity: Not on file  Transportation Needs: Not on file  Physical Activity: Not on file  Stress: Not on file  Social Connections: Not on file    Allergies: No Known Allergies  Metabolic Disorder Labs: No results found for: "HGBA1C", "MPG" No results found for: "PROLACTIN" No results found for:  "CHOL", "TRIG", "HDL", "CHOLHDL", "VLDL", "LDLCALC" No results found for: "TSH"  Therapeutic Level Labs: No results found for: "LITHIUM" No results found for: "VALPROATE" No results found for: "CBMZ"  Current Medications: Current Outpatient Medications  Medication Sig Dispense Refill   aspirin 81 MG chewable tablet Chew 162 mg by mouth daily.     atorvastatin (LIPITOR) 80 MG tablet Take 40 mg by mouth daily.     Cyanocobalamin (VITAMIN B 12 PO) Take by mouth.     memantine (NAMENDA) 10 MG tablet Take 10 mg by mouth 2 (two) times daily.     lithium carbonate 150 MG capsule Take 1 capsule (150 mg total) by mouth daily. 90 capsule 1   valsartan-hydrochlorothiazide (DIOVAN-HCT) 320-12.5 MG tablet Take 1 tablet by mouth daily. (Patient not taking: Reported on 09/07/2022)     VIAGRA 50 MG tablet Take 50 mg by mouth as needed. (Patient not taking: Reported on 03/08/2023)     No current facility-administered medications for this visit.    Medication Side Effects: none  Orders placed this visit:  No orders of the defined types were placed in this encounter.   Psychiatric Specialty Exam:  Review of Systems  Constitutional:  Negative for fatigue.  HENT:  Negative for congestion, sinus pain, tinnitus and voice change.   Eyes:  Negative for visual disturbance.  Respiratory:  Negative for shortness of breath and wheezing.   Cardiovascular:  Negative for chest pain and palpitations.  Gastrointestinal:  Negative for diarrhea, nausea and vomiting.  Endocrine: Negative for polyuria.  Genitourinary:  Negative for difficulty urinating and enuresis.  Musculoskeletal:  Negative for arthralgias.  Skin:  Negative for rash.  Neurological:  Negative for dizziness, tremors and weakness.  Psychiatric/Behavioral:  Positive for confusion and decreased concentration. Negative for agitation, dysphoric mood, hallucinations, sleep disturbance and suicidal ideas. The patient is not nervous/anxious.     Blood  pressure (!) 141/86, pulse 84, height 5\' 8"  (1.727 m), weight 175 lb (79.4 kg).Body mass index is 26.61 kg/m.  General Appearance: Casual and Well Groomed  Eye Contact:  Good  Speech:   clear and relevant but not talkative usually  Volume:  Normal  Mood:  Euthymic  Affect:  Restricted, no distress  Thought Process:  Coherent and Descriptions of Associations: Intact  Orientation:  Other:  impaired STM  Thought Content: Hallucinations: None ; no delusions  Suicidal Thoughts:  No  Homicidal Thoughts:  No  Memory: impaired STM  Judgement:  Impaired  Insight:  Lacking  Psychomotor Activity:  Normal  Concentration:  Concentration: Fair  Recall:  Poor  Fund of Knowledge: Fair  Language: Good  Assets:  Financial Resources/Insurance Housing Intimacy Leisure Time Physical Health Social Support Talents/Skills  ADL's:  Intact  Cognition: Impaired,  Moderate  Prognosis:  Fair   Screenings: MDQ negative  Receiving Psychotherapy: No   Treatment Plan/Recommendations:   Disc DX and tx plan in depth.  Agree with prior DX Alzheimer's.  Disc neuropsych results.  Agree with trial donepezil but not tolerateed.  Continue memantine  Extensive discussion with wife about how to adjust expectations and responses to memory impairment.   Supportive tx with pt and wife about adjusting expectations and dealing with his memory imparirment 60 min session.  Supportive therapy dealing with frustrations between he and wife over his memory concerns.    No med changes  FU prn and after neuro FU  Lauraine Rinne, MD

## 2023-03-13 ENCOUNTER — Encounter: Payer: Self-pay | Admitting: Psychiatry

## 2023-04-13 DIAGNOSIS — J0100 Acute maxillary sinusitis, unspecified: Secondary | ICD-10-CM | POA: Diagnosis not present

## 2023-04-13 DIAGNOSIS — J069 Acute upper respiratory infection, unspecified: Secondary | ICD-10-CM | POA: Diagnosis not present

## 2023-06-06 DIAGNOSIS — C44319 Basal cell carcinoma of skin of other parts of face: Secondary | ICD-10-CM | POA: Diagnosis not present

## 2023-06-06 DIAGNOSIS — C44519 Basal cell carcinoma of skin of other part of trunk: Secondary | ICD-10-CM | POA: Diagnosis not present

## 2023-06-06 DIAGNOSIS — Z85828 Personal history of other malignant neoplasm of skin: Secondary | ICD-10-CM | POA: Diagnosis not present

## 2023-06-06 DIAGNOSIS — C4441 Basal cell carcinoma of skin of scalp and neck: Secondary | ICD-10-CM | POA: Diagnosis not present

## 2023-06-06 DIAGNOSIS — L905 Scar conditions and fibrosis of skin: Secondary | ICD-10-CM | POA: Diagnosis not present

## 2023-06-06 DIAGNOSIS — L821 Other seborrheic keratosis: Secondary | ICD-10-CM | POA: Diagnosis not present

## 2023-06-06 DIAGNOSIS — L570 Actinic keratosis: Secondary | ICD-10-CM | POA: Diagnosis not present

## 2023-06-06 DIAGNOSIS — L720 Epidermal cyst: Secondary | ICD-10-CM | POA: Diagnosis not present

## 2023-07-10 ENCOUNTER — Ambulatory Visit: Payer: Medicare Other | Admitting: Psychiatry

## 2023-09-02 ENCOUNTER — Other Ambulatory Visit: Payer: Self-pay | Admitting: Psychiatry

## 2023-09-02 DIAGNOSIS — G301 Alzheimer's disease with late onset: Secondary | ICD-10-CM

## 2023-09-13 DIAGNOSIS — R2689 Other abnormalities of gait and mobility: Secondary | ICD-10-CM | POA: Diagnosis not present

## 2023-09-13 DIAGNOSIS — G309 Alzheimer's disease, unspecified: Secondary | ICD-10-CM | POA: Diagnosis not present

## 2023-09-13 DIAGNOSIS — G3183 Dementia with Lewy bodies: Secondary | ICD-10-CM | POA: Diagnosis not present

## 2023-09-13 DIAGNOSIS — F0280 Dementia in other diseases classified elsewhere without behavioral disturbance: Secondary | ICD-10-CM | POA: Diagnosis not present

## 2023-09-19 DIAGNOSIS — F0280 Dementia in other diseases classified elsewhere without behavioral disturbance: Secondary | ICD-10-CM | POA: Diagnosis not present

## 2023-09-19 DIAGNOSIS — G3183 Dementia with Lewy bodies: Secondary | ICD-10-CM | POA: Diagnosis not present

## 2023-09-19 DIAGNOSIS — G309 Alzheimer's disease, unspecified: Secondary | ICD-10-CM | POA: Diagnosis not present

## 2023-09-20 ENCOUNTER — Ambulatory Visit: Payer: Medicare Other | Admitting: Psychiatry

## 2023-09-20 ENCOUNTER — Encounter: Payer: Self-pay | Admitting: Psychiatry

## 2023-09-20 VITALS — BP 104/62 | HR 76

## 2023-09-20 DIAGNOSIS — G301 Alzheimer's disease with late onset: Secondary | ICD-10-CM | POA: Diagnosis not present

## 2023-09-20 DIAGNOSIS — F02B0 Dementia in other diseases classified elsewhere, moderate, without behavioral disturbance, psychotic disturbance, mood disturbance, and anxiety: Secondary | ICD-10-CM

## 2023-09-20 NOTE — Progress Notes (Signed)
 Randy Morton [id number] 10-06-1945 78 y.o.  Subjective:   Patient ID:  Randy Morton is a 78 y.o. (DOB [birth year]) male.  Chief Complaint:  Chief Complaint  Patient presents with   Follow-up   Memory Loss    HPI Randy Morton presents to the office today for follow-up of memory loss.   Wife recognizes sx consistent with Lewy Body.  Neuro said he shouldn't drive.    Feels pretty good. No new concerns.  Not anxious.  No dep.  Notices memory px might be getting worse.  He denies anger but wife says it comes out with frustration.   Sleep good.  No NM.  Occ RLS.  Won't drink water.  Only diet coke or sweet tea.     [facility] 09/13/23 dx: most likely underlying pathological process contributing to the cognitive problems in Randy Morton is dementia possibly due to Alzheimer's disease or Lewy Body Dementia or both. Will do biomarker testing to confirm diagnosis.  MMSE 20/30 Rec continue memantine.  Suggested PT.  Told them stage 4 Alz and possibly Lewy Body with pending skin test.   FU July 16.    Will see Dr. Schuyler Custard soon. No SE  Son Will lives locally.   He'll gut guns out of the house.  No SI.     Review of Systems:  Review of Systems  Gastrointestinal:  Positive for constipation.  Genitourinary:  Positive for frequency.  Neurological:  Negative for dizziness, tremors, seizures, speech difficulty, weakness, light-headedness and headaches.    Medications: I have reviewed the patient's current medications.  Current Outpatient Medications  Medication Sig Dispense Refill   aspirin 81 MG chewable tablet Chew 162 mg by mouth daily.     atorvastatin (LIPITOR) 80 MG tablet Take 40 mg by mouth daily.     lithium  carbonate 150 MG capsule TAKE 1 CAPSULE BY MOUTH EVERY DAY 30 capsule 0   memantine (NAMENDA) 10 MG tablet Take 10 mg by mouth 2 (two) times daily.     Cyanocobalamin  (VITAMIN B 12 PO) Take by mouth. (Patient not taking: Reported on 09/20/2023)      valsartan-hydrochlorothiazide (DIOVAN-HCT) 320-12.5 MG tablet Take 1 tablet by mouth daily. (Patient not taking: Reported on 09/07/2022)     VIAGRA 50 MG tablet Take 50 mg by mouth as needed. (Patient not taking: Reported on 03/08/2023)     No current facility-administered medications for this visit.    Medication Side Effects: None  Allergies: No Known Allergies  Past Medical History:  Diagnosis Date   Abdominal aortic aneurysm (AAA) (HCC)    Known 1.5cm   Barrett esophagus    Carotid artery stenosis, symptomatic, left    COPD (chronic obstructive pulmonary disease)    Diverticular disease of colon    Diverticulosis of small intestine    Gastroesophageal reflux disease    History of cancer    Hypercholesteremia    Hypertension    Lacunar infarction 11/17/2020   right cerebellum; seen on brain MRI   Mild cognitive impairment with memory loss 04/23/2021   Personal history of colonic polyps    Tobacco use 01/29/2018    Past Medical History, Surgical history, Social history, and Family history were reviewed and updated as appropriate.   Please see review of systems for further details on the patient's review from today.   Objective:   Physical Exam:  There were no vitals taken for this visit.  Physical Exam Constitutional:      General: He  is not in acute distress.    Appearance: He is well-developed.   Musculoskeletal:        General: No deformity.   Neurological:     Mental Status: He is alert and oriented to person, place, and time.     Coordination: Coordination normal.   Psychiatric:        Attention and Perception: Attention normal. He is attentive.        Mood and Affect: Mood normal. Mood is not anxious or depressed. Affect is not labile, blunt, angry or inappropriate.        Speech: Speech normal.        Behavior: Behavior normal.        Thought Content: Thought content normal. Thought content does not include homicidal or suicidal ideation. Thought content  does not include homicidal or suicidal plan.        Cognition and Memory: Cognition is impaired. He exhibits impaired recent memory.     Comments: Insight and judgment fair.     Lab Review:     Component Value Date/Time   NA 136 05/13/2018 1843   K 3.7 05/13/2018 1843   CL 104 05/13/2018 1843   CO2 23 05/13/2018 1843   GLUCOSE 97 05/13/2018 1843   BUN 20 05/13/2018 1843   CREATININE 1.55 (H) 05/13/2018 1843   CALCIUM 8.4 (L) 05/13/2018 1843   PROT 6.1 (L) 05/13/2018 1843   ALBUMIN 3.6 05/13/2018 1843   AST 30 05/13/2018 1843   ALT 32 05/13/2018 1843   ALKPHOS 51 05/13/2018 1843   BILITOT 0.9 05/13/2018 1843   GFRNONAA 43 (L) 05/13/2018 1843   GFRAA 50 (L) 05/13/2018 1843       Component Value Date/Time   WBC 6.4 05/13/2018 1621   RBC 4.41 05/13/2018 1621   HGB 13.6 05/13/2018 1621   HCT 41.6 05/13/2018 1621   PLT 158 05/13/2018 1621   MCV 94.3 05/13/2018 1621   MCH 30.8 05/13/2018 1621   MCHC 32.7 05/13/2018 1621   RDW 13.6 05/13/2018 1621   LYMPHSABS 0.4 (L) 05/13/2018 1621   MONOABS 0.6 05/13/2018 1621   EOSABS 0.1 05/13/2018 1621   BASOSABS 0.0 05/13/2018 1621    No results found for: POCLITH, LITHIUM    No results found for: PHENYTOIN, PHENOBARB, VALPROATE, CBMZ   .res Assessment: Plan:    [name] was seen today for follow-up and memory loss.  Diagnoses and all orders for this visit:  Moderate late onset Alzheimer's dementia, unspecified whether behavioral, psychotic, or mood disturbance or anxiety (HCC)   30 min appt:   Disc neuro appt and dx dementia with ? Alz vs LBD and it's implication.s  Increase hydration.  Reduce caffeine to improve hydration  Continue lithium  150 and memantine 10 BID  Supportive therapy dealing with pending dx and apparent moderate dementia .  Disc SS that would suggest changes needed for other psych sx would suggest tx changes by psychiatrist.  Option SSRi.  For agitation if it gets worse. Answered questions  about meds  and types and when they might be needed.   Disc getting guns out of house.    Be careful and avoid driving.   Rec B12.  Option Neuriva.  FU August  Nori Beat, MD, DFAPA   Please see After Visit Summary for patient specific instructions.  No future appointments.  No orders of the defined types were placed in this encounter.   -------------------------------

## 2023-09-24 ENCOUNTER — Other Ambulatory Visit: Payer: Self-pay | Admitting: Psychiatry

## 2023-09-24 DIAGNOSIS — G301 Alzheimer's disease with late onset: Secondary | ICD-10-CM

## 2023-10-09 DIAGNOSIS — Z125 Encounter for screening for malignant neoplasm of prostate: Secondary | ICD-10-CM | POA: Diagnosis not present

## 2023-10-09 DIAGNOSIS — I10 Essential (primary) hypertension: Secondary | ICD-10-CM | POA: Diagnosis not present

## 2023-10-10 LAB — LAB REPORT - SCANNED: EGFR: 49

## 2023-10-12 DIAGNOSIS — Z0000 Encounter for general adult medical examination without abnormal findings: Secondary | ICD-10-CM | POA: Diagnosis not present

## 2023-10-12 DIAGNOSIS — G309 Alzheimer's disease, unspecified: Secondary | ICD-10-CM | POA: Diagnosis not present

## 2023-10-12 DIAGNOSIS — I10 Essential (primary) hypertension: Secondary | ICD-10-CM | POA: Diagnosis not present

## 2023-10-12 DIAGNOSIS — I6523 Occlusion and stenosis of bilateral carotid arteries: Secondary | ICD-10-CM | POA: Diagnosis not present

## 2023-10-12 DIAGNOSIS — I2510 Atherosclerotic heart disease of native coronary artery without angina pectoris: Secondary | ICD-10-CM | POA: Diagnosis not present

## 2023-10-18 DIAGNOSIS — G3183 Dementia with Lewy bodies: Secondary | ICD-10-CM | POA: Diagnosis not present

## 2023-10-18 DIAGNOSIS — F0280 Dementia in other diseases classified elsewhere without behavioral disturbance: Secondary | ICD-10-CM | POA: Diagnosis not present

## 2023-10-18 DIAGNOSIS — G309 Alzheimer's disease, unspecified: Secondary | ICD-10-CM | POA: Diagnosis not present

## 2023-10-19 DIAGNOSIS — Z79899 Other long term (current) drug therapy: Secondary | ICD-10-CM | POA: Diagnosis not present

## 2023-10-29 ENCOUNTER — Ambulatory Visit: Payer: Self-pay | Admitting: Cardiology

## 2023-11-30 ENCOUNTER — Encounter: Payer: Self-pay | Admitting: Psychiatry

## 2023-11-30 ENCOUNTER — Ambulatory Visit: Admitting: Psychiatry

## 2023-11-30 DIAGNOSIS — F02B0 Dementia in other diseases classified elsewhere, moderate, without behavioral disturbance, psychotic disturbance, mood disturbance, and anxiety: Secondary | ICD-10-CM | POA: Diagnosis not present

## 2023-11-30 DIAGNOSIS — G301 Alzheimer's disease with late onset: Secondary | ICD-10-CM

## 2023-11-30 MED ORDER — LITHIUM CARBONATE 150 MG PO CAPS: 150.0000 mg | ORAL_CAPSULE | Freq: Every day | ORAL | 3 refills | Status: DC

## 2023-11-30 MED ORDER — ESCITALOPRAM OXALATE 5 MG PO TABS: 5.0000 mg | ORAL_TABLET | Freq: Every day | ORAL | 1 refills | Status: DC

## 2023-11-30 NOTE — Progress Notes (Signed)
 Randy Morton [id number] Sep 25, 1945 78 y.o.  Subjective:   Patient ID:  Randy Morton is a 78 y.o. (DOB 07/12/1945) male.  Chief Complaint:  Chief Complaint  Patient presents with   Follow-up   Memory Loss    HPI AVANT PRINTY presents to the office today for follow-up of memory loss.   Wife recognizes sx consistent with Lewy Body.  Neuro said he shouldn't drive.    09/20/23 appt:  Feels pretty good. No new concerns.  Not anxious.  No dep.  Notices memory px might be getting worse.  He denies anger but wife says it comes out with frustration.   Sleep good.  No NM.  Occ RLS.  Won't drink water.  Only diet coke or sweet tea.    [facility] 09/13/23 dx: most likely underlying pathological process contributing to the cognitive problems in [facility] is dementia possibly due to Alzheimer's disease or Lewy Body Dementia or both. Will do biomarker testing to confirm diagnosis.  MMSE 20/30 Rec continue memantine.  Suggested PT.  Told them stage 4 Alz and possibly Lewy Body with pending skin test.   FU July 16.   Will see Dr. [name] soon. No SE  11/30/23 appt noted:  Med: lithium  150 and memantine 10 BID [name] from [facility] called and negative for [name] Body but positive for Alzheimer's.  She offered consultation.  Still some irritability.  She sees it as mostly reactive.  Would like to try SSRI.  Dreams vividly. Sleeping longer hours.  Some naps in afternoon.  To bed 930 pm. No sundowning.   Appetite and weight stable. No longer interest in golf.  Doesn't feel capable of it.    Son Will lives locally.   He'll gut guns out of the house.  No SI.     Review of Systems:  Review of Systems  Gastrointestinal:  Positive for constipation.  Genitourinary:  Positive for frequency.  Neurological:  Negative for dizziness, tremors, seizures, speech difficulty, weakness, light-headedness and headaches.    Medications: I have reviewed the patient's current medications.  Current  Outpatient Medications  Medication Sig Dispense Refill   aspirin 81 MG chewable tablet Chew 162 mg by mouth daily.     atorvastatin (LIPITOR) 80 MG tablet Take 40 mg by mouth daily.     Cyanocobalamin  (VITAMIN B 12 PO) Take by mouth.     lithium  carbonate 150 MG capsule TAKE 1 CAPSULE BY MOUTH EVERY DAY 90 capsule 0   memantine (NAMENDA) 10 MG tablet Take 10 mg by mouth 2 (two) times daily.     valsartan-hydrochlorothiazide (DIOVAN-HCT) 320-12.5 MG tablet Take 1 tablet by mouth daily.     VIAGRA 50 MG tablet Take 50 mg by mouth as needed.     No current facility-administered medications for this visit.    Medication Side Effects: None  Allergies: No Known Allergies  Past Medical History:  Diagnosis Date   Abdominal aortic aneurysm (AAA) (HCC)    Known 1.5cm   Barrett esophagus    Carotid artery stenosis, symptomatic, left    COPD (chronic obstructive pulmonary disease)    Diverticular disease of colon    Diverticulosis of small intestine    Gastroesophageal reflux disease    History of cancer    Hypercholesteremia    Hypertension    Lacunar infarction 11/17/2020   right cerebellum; seen on brain MRI   Mild cognitive impairment with memory loss 04/23/2021   Personal history of colonic polyps  Tobacco use 01/29/2018    Past Medical History, Surgical history, Social history, and Family history were reviewed and updated as appropriate.   Please see review of systems for further details on the patient's review from today.   Objective:   Physical Exam:  There were no vitals taken for this visit.  Physical Exam Constitutional:      General: He is not in acute distress.    Appearance: He is well-developed.  Musculoskeletal:        General: No deformity.  Neurological:     Mental Status: He is alert and oriented to person, place, and time.     Coordination: Coordination normal.  Psychiatric:        Attention and Perception: Attention normal. He is attentive.         Mood and Affect: Mood normal. Mood is not anxious or depressed. Affect is not labile, blunt, angry or inappropriate.        Speech: Speech normal.        Behavior: Behavior normal.        Thought Content: Thought content normal. Thought content does not include homicidal or suicidal ideation. Thought content does not include homicidal or suicidal plan.        Cognition and Memory: Cognition is impaired. He exhibits impaired recent memory.     Comments: Insight and judgment fair.     Lab Review:     Component Value Date/Time   NA 136 05/13/2018 1843   K 3.7 05/13/2018 1843   CL 104 05/13/2018 1843   CO2 23 05/13/2018 1843   GLUCOSE 97 05/13/2018 1843   BUN 20 05/13/2018 1843   CREATININE 1.55 (H) 05/13/2018 1843   CALCIUM 8.4 (L) 05/13/2018 1843   PROT 6.1 (L) 05/13/2018 1843   ALBUMIN 3.6 05/13/2018 1843   AST 30 05/13/2018 1843   ALT 32 05/13/2018 1843   ALKPHOS 51 05/13/2018 1843   BILITOT 0.9 05/13/2018 1843   GFRNONAA 43 (L) 05/13/2018 1843   GFRAA 50 (L) 05/13/2018 1843       Component Value Date/Time   WBC 6.4 05/13/2018 1621   RBC 4.41 05/13/2018 1621   HGB 13.6 05/13/2018 1621   HCT 41.6 05/13/2018 1621   PLT 158 05/13/2018 1621   MCV 94.3 05/13/2018 1621   MCH 30.8 05/13/2018 1621   MCHC 32.7 05/13/2018 1621   RDW 13.6 05/13/2018 1621   LYMPHSABS 0.4 (L) 05/13/2018 1621   MONOABS 0.6 05/13/2018 1621   EOSABS 0.1 05/13/2018 1621   BASOSABS 0.0 05/13/2018 1621   09/19/23 pTau-217 - [facility] Not Estab. pg/mL0.836The quantitative range of this assay is 0.060 to 10.000 pg/mL.Beta-amyloid 42 - [facility] Not Estab. pg/mL28.3The quantitative range of this assay is 1.0 to 230.0 pg/mL.pTau-217/AB42 Ratio - [facility] 0.0295 High                                  Low             <0.0086                             Intermediate 0.0086 - 0.0152                                  High            >  0.0152Resulting Agency[facility]  Assessment: Plan:    Kieron was seen today for  follow-up and memory loss.  Diagnoses and all orders for this visit:  Moderate late onset Alzheimer's dementia, unspecified whether behavioral, psychotic, or mood disturbance or anxiety (HCC)    30 min appt:   Disc neuro appt and dx dementia with Alzh but rule out [name] Body.  Increase hydration.  Reduce caffeine to improve hydration  Continue lithium  150 and memantine 10 BID  Supportive therapy dealing with pending dx and apparent moderate dementia .  Disc SS that would suggest changes needed for other psych sx would suggest tx changes by psychiatrist.  Option SSRi.  For agitation if it gets worse.  Yes.  Answered questions about meds  and types and when they might be needed.  Add Lexapro  5 for moodiness and irritability.     guns out of house.    Be careful and avoid driving.   Rec B12.  Option Neuriva.  FU 3 mos  Lorene Macintosh, MD, DFAPA   Please see After Visit Summary for patient specific instructions.  No future appointments.  No orders of the defined types were placed in this encounter.   -------------------------------

## 2023-12-20 DIAGNOSIS — L905 Scar conditions and fibrosis of skin: Secondary | ICD-10-CM | POA: Diagnosis not present

## 2023-12-20 DIAGNOSIS — L821 Other seborrheic keratosis: Secondary | ICD-10-CM | POA: Diagnosis not present

## 2023-12-20 DIAGNOSIS — Z85820 Personal history of malignant melanoma of skin: Secondary | ICD-10-CM | POA: Diagnosis not present

## 2023-12-20 DIAGNOSIS — Z85828 Personal history of other malignant neoplasm of skin: Secondary | ICD-10-CM | POA: Diagnosis not present

## 2024-02-21 ENCOUNTER — Ambulatory Visit: Admitting: Psychiatry

## 2024-02-21 ENCOUNTER — Encounter: Payer: Self-pay | Admitting: Psychiatry

## 2024-02-21 DIAGNOSIS — G301 Alzheimer's disease with late onset: Secondary | ICD-10-CM | POA: Diagnosis not present

## 2024-02-21 DIAGNOSIS — F02B0 Dementia in other diseases classified elsewhere, moderate, without behavioral disturbance, psychotic disturbance, mood disturbance, and anxiety: Secondary | ICD-10-CM

## 2024-02-21 MED ORDER — ESCITALOPRAM OXALATE 10 MG PO TABS: 10.0000 mg | ORAL_TABLET | Freq: Every day | ORAL | 0 refills | Status: AC

## 2024-02-21 MED ORDER — LITHIUM CARBONATE 300 MG PO CAPS: 300.0000 mg | ORAL_CAPSULE | Freq: Every day | ORAL | 1 refills | Status: DC

## 2024-02-21 NOTE — Patient Instructions (Addendum)
 Increase Lexapro  to 10 mg nightly for irritability and mood. Switch lithium  to 300 mg nightly  (NAC) N-Acetylcysteine 2 of the  600 mg capsules daily can help with cognitive problems.
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
## 2024-04-22 ENCOUNTER — Ambulatory Visit: Admitting: Psychiatry

## 2024-04-22 ENCOUNTER — Encounter: Payer: Self-pay | Admitting: Psychiatry

## 2024-04-22 DIAGNOSIS — F02B0 Dementia in other diseases classified elsewhere, moderate, without behavioral disturbance, psychotic disturbance, mood disturbance, and anxiety: Secondary | ICD-10-CM

## 2024-04-22 DIAGNOSIS — G301 Alzheimer's disease with late onset: Secondary | ICD-10-CM

## 2024-04-22 MED ORDER — LITHIUM CARBONATE 150 MG PO CAPS: 150.0000 mg | ORAL_CAPSULE | Freq: Every day | ORAL | 1 refills | Status: AC

## 2024-04-22 MED ORDER — SERTRALINE HCL 100 MG PO TABS: 100.0000 mg | ORAL_TABLET | Freq: Every day | ORAL | 0 refills | Status: AC

## 2024-04-22 NOTE — Patient Instructions (Addendum)
 Starting next week:  Reduce Lexapro  to 1/2 tablet daily for 1 week with 1/2 tablet sertraline  tablet for 1 week , Then 1 sertraline  tablet daily and stop Lexapro .    Reduce lithium  to 150 mg nightly to get rid of tremor

## 2024-04-22 NOTE — Progress Notes (Signed)
 Randy Morton [id number] 1945/12/08 79 y.o.  Subjective:   Patient ID:  Randy Morton is a 79 y.o. (DOB 1945-07-05) male.  Chief Complaint:  Chief Complaint  Patient presents with   Follow-up    Irritability and memory   Medication Reaction    HPI Randy Morton presents to the office today for follow-up of memory loss.   Wife recognizes sx consistent with Lewy Body.  Neuro said he shouldn't drive.    09/20/23 appt:  Feels pretty good. No new concerns.  Not anxious.  No dep.  Notices memory px might be getting worse.  He denies anger but wife says it comes out with frustration.   Sleep good.  No NM.  Occ RLS.  Won't drink water.  Only diet coke or sweet tea.    [facility] 09/13/23 dx: most likely underlying pathological process contributing to the cognitive problems in Randy Morton is dementia possibly due to Alzheimer's disease or Lewy Body Dementia or both. Will do biomarker testing to confirm diagnosis.  MMSE 20/30 Rec continue memantine.  Suggested PT.  Told them stage 4 Alz and possibly Lewy Body with pending skin test.   FU July 16.   Randy Morton see Dr. [name] soon. No SE  11/30/23 appt noted:  Med: lithium  150 and memantine 10 BID [name] from [facility] called and negative for [name] Body but positive for Alzheimer's.  She offered consultation.  Still some irritability.  She sees it as mostly reactive.  Would like to try SSRI.  Dreams vividly. Sleeping longer hours.  Some naps in afternoon.  To bed 930 pm. No sundowning.   Appetite and weight stable. No longer interest in golf.  Doesn't feel capable of it.   Plan: Add Lexapro  5 for moodiness and irritability.    02/21/24 appt noted with wife: Med: Added Lexapro  5 for moodiness and irritability, lithium  150 BID, and memantine 10 BID No neuro FU since last visit here. But has been made aware of Lexapro .   He doesn't not see change in effect from Lexapro .  She thinks maybe a little.   She reports Monday seems like the  first time he heard about Alz.  He's upset over the dx.  But he complains of no energy and doesn't want to do much.   Accepted into [facility].  She Randy Morton have to manage the move.   Still sleeping a lot.   Good LTM and poor STM.   She still sees a lot of rumination.  Some of it even back to childhood when B killed by a truck.   Irritability a little better.  He gets upset over things that he didn't get upset about it before.   More lack of interest since retired.   PCP Dr Randy Morton Plan: Increase Lexapro  to 10 mg HS for dep and irritability.   Increase lithium  300 mg HS to see if irritability would improve.  04/22/24 appt noted: with wife, Randy Morton Med: incr Lexapro  10 for moodiness and irritability, lithium  300 mg HS, and memantine 10 BID, NAC 600 mg daily SE tremor over the last month or 2.  Handwriting. Dr. [name] checked lithium  level and it was normal. Feels good.  Denies moodiness.  Wife says outbursts once weekly or so usually in frustration if something goes wrong or stressor.   Reduced hearing.   Sleepiness is not different.  Sleep hard into the morning.   No sig benefit noted with Lexapro .  His best friend from HS died  just before Xmas and relives it daily.  [employer] [facility] this [facility].  Been upset over it the last 6 weeks with repeated questions over it. Accepted into [facility] and working on it.  Difficult to move of course.   Son Randy Morton lives locally.   He'll gut guns out of the house.  No SI.    Med:  lithium  300, Lexapro  10   Review of Systems:  Review of Systems  Gastrointestinal:  Positive for constipation.  Genitourinary:  Positive for frequency.  Neurological:  Negative for dizziness, tremors, seizures, speech difficulty, weakness, light-headedness and headaches.  Psychiatric/Behavioral:  Positive for decreased concentration.     Medications: I have reviewed the patient's current medications.  Current Outpatient Medications  Medication Sig Dispense Refill    Acetylcysteine 600 MG CAPS Take 600 mg by mouth daily.     aspirin 81 MG chewable tablet Chew 162 mg by mouth daily.     atorvastatin (LIPITOR) 80 MG tablet Take 40 mg by mouth daily.     Cyanocobalamin  (VITAMIN B 12 PO) Take by mouth.     escitalopram  (LEXAPRO ) 10 MG tablet Take 1 tablet (10 mg total) by mouth daily. 90 tablet 0   memantine (NAMENDA) 10 MG tablet Take 10 mg by mouth 2 (two) times daily.     sertraline  (ZOLOFT ) 100 MG tablet Take 1 tablet (100 mg total) by mouth daily. 90 tablet 0   lithium  carbonate 150 MG capsule Take 1 capsule (150 mg total) by mouth daily. 90 capsule 1   No current facility-administered medications for this visit.    Medication Side Effects: None  Allergies: No Known Allergies  Past Medical History:  Diagnosis Date   Abdominal aortic aneurysm (AAA)    Known 1.5cm   Barrett esophagus    Carotid artery stenosis, symptomatic, left    COPD (chronic obstructive pulmonary disease)    Diverticular disease of colon    Diverticulosis of small intestine    Gastroesophageal reflux disease    History of cancer    Hypercholesteremia    Hypertension    Lacunar infarction 11/17/2020   right cerebellum; seen on brain MRI   Mild cognitive impairment with memory loss 04/23/2021   Personal history of colonic polyps    Tobacco use 01/29/2018    Past Medical History, Surgical history, Social history, and Family history were reviewed and updated as appropriate.   Please see review of systems for further details on the patient's review from today.   Objective:   Physical Exam:  There were no vitals taken for this visit.  Physical Exam Constitutional:      General: He is not in acute distress.    Appearance: He is well-developed.  Musculoskeletal:        General: No deformity.  Neurological:     Mental Status: He is alert and oriented to person, place, and time.     Coordination: Coordination normal.  Psychiatric:        Attention and Perception:  Attention normal. He is attentive.        Mood and Affect: Mood is depressed. Mood is not anxious. Affect is not labile, blunt, angry or inappropriate.        Speech: Speech normal.        Behavior: Behavior normal.        Thought Content: Thought content normal. Thought content is not delusional. Thought content does not include homicidal or suicidal ideation. Thought content does not include suicidal plan.  Cognition and Memory: Cognition is impaired. He exhibits impaired recent memory.     Comments: Insight and judgment fair. More aware of deficits and down over dx dementia.       Lab Review:     Component Value Date/Time   NA 136 05/13/2018 1843   K 3.7 05/13/2018 1843   CL 104 05/13/2018 1843   CO2 23 05/13/2018 1843   GLUCOSE 97 05/13/2018 1843   BUN 20 05/13/2018 1843   CREATININE 1.55 (H) 05/13/2018 1843   CALCIUM 8.4 (L) 05/13/2018 1843   PROT 6.1 (L) 05/13/2018 1843   ALBUMIN 3.6 05/13/2018 1843   AST 30 05/13/2018 1843   ALT 32 05/13/2018 1843   ALKPHOS 51 05/13/2018 1843   BILITOT 0.9 05/13/2018 1843   GFRNONAA 43 (L) 05/13/2018 1843   GFRAA 50 (L) 05/13/2018 1843       Component Value Date/Time   WBC 6.4 05/13/2018 1621   RBC 4.41 05/13/2018 1621   HGB 13.6 05/13/2018 1621   HCT 41.6 05/13/2018 1621   PLT 158 05/13/2018 1621   MCV 94.3 05/13/2018 1621   MCH 30.8 05/13/2018 1621   MCHC 32.7 05/13/2018 1621   RDW 13.6 05/13/2018 1621   LYMPHSABS 0.4 (L) 05/13/2018 1621   MONOABS 0.6 05/13/2018 1621   EOSABS 0.1 05/13/2018 1621   BASOSABS 0.0 05/13/2018 1621   09/19/23 pTau-217 - [facility] Not Estab. pg/mL0.836The quantitative range of this assay is 0.060 to 10.000 pg/mL.Beta-amyloid 42 - [facility] Not Estab. pg/mL28.3The quantitative range of this assay is 1.0 to 230.0 pg/mL.pTau-217/AB42 Ratio - [facility] 0.0295 High                                  Low             <0.0086                             Intermediate 0.0086 - 0.0152                                   High            >0.0152Resulting AgencyLABCORP  Assessment: Plan:    Anush was seen today for follow-up and medication reaction.  Diagnoses and all orders for this visit:  Moderate late onset Alzheimer's dementia, with irritability (HCC) -     lithium  carbonate 150 MG capsule; Take 1 capsule (150 mg total) by mouth daily. -     sertraline  (ZOLOFT ) 100 MG tablet; Take 1 tablet (100 mg total) by mouth daily.    30 min appt:   Disc neuro appt and dx dementia with Alzh  with rapid decline over the last 2 years.   Increase hydration.  Reduce caffeine to improve hydration  Randy Morton move to [facility].  memantine 10 BID  Supportive therapy dealing with pending dx and apparent moderate dementia .  Disc SS that would suggest changes needed for other psych sx would suggest tx changes by psychiatrist.  Disc dx.  Option SSRi.  For agitation if it gets worse.  Yes.  Answered questions about meds  and types and when they might be needed.   Starting next week:  Reduce Lexapro  to 1/2 tablet daily for 1 week with 1/2 tablet sertraline  tablet for 1 week , Then 1 sertraline   tablet daily and stop Lexapro .    Reduce lithium  to 150 mg nightly.  guns are out of house.    Be careful and avoid driving.   Rec continue B12.  Option Neuriva.  FU 8 weeks  Lorene Macintosh, MD, DFAPA   Please see After Visit Summary for patient specific instructions.  No future appointments.    No orders of the defined types were placed in this encounter.   -------------------------------

## 2024-06-19 ENCOUNTER — Ambulatory Visit: Admitting: Psychiatry
